# Patient Record
Sex: Female | Born: 1965
Health system: Southern US, Community
[De-identification: ages and names within clinical notes are randomized; demographics above are authoritative.]

## PROBLEM LIST (undated history)

## (undated) HISTORY — PX: HERNIA REPAIR: SHX51

---

## 1991-08-29 HISTORY — PX: FOOT SURGERY: SHX648

## 1998-03-15 ENCOUNTER — Other Ambulatory Visit: Admission: RE | Admit: 1998-03-15 | Discharge: 1998-03-15 | Payer: Self-pay | Admitting: Gynecology

## 1998-03-22 ENCOUNTER — Other Ambulatory Visit: Admission: RE | Admit: 1998-03-22 | Discharge: 1998-03-22 | Payer: Self-pay | Admitting: Gynecology

## 1998-07-14 ENCOUNTER — Encounter: Admission: RE | Admit: 1998-07-14 | Discharge: 1998-10-12 | Payer: Self-pay | Admitting: Gynecology

## 1998-09-04 ENCOUNTER — Inpatient Hospital Stay (HOSPITAL_COMMUNITY): Admission: AD | Admit: 1998-09-04 | Discharge: 1998-09-08 | Payer: Self-pay | Admitting: Gynecology

## 1998-09-09 ENCOUNTER — Encounter (HOSPITAL_COMMUNITY): Admission: RE | Admit: 1998-09-09 | Discharge: 1998-12-08 | Payer: Self-pay | Admitting: Gynecology

## 1999-12-12 ENCOUNTER — Other Ambulatory Visit: Admission: RE | Admit: 1999-12-12 | Discharge: 1999-12-12 | Payer: Self-pay | Admitting: Gynecology

## 2000-01-18 ENCOUNTER — Encounter (INDEPENDENT_AMBULATORY_CARE_PROVIDER_SITE_OTHER): Payer: Self-pay

## 2000-01-18 ENCOUNTER — Other Ambulatory Visit: Admission: RE | Admit: 2000-01-18 | Discharge: 2000-01-18 | Payer: Self-pay | Admitting: Gynecology

## 2000-06-15 ENCOUNTER — Other Ambulatory Visit: Admission: RE | Admit: 2000-06-15 | Discharge: 2000-06-15 | Payer: Self-pay | Admitting: Gynecology

## 2001-02-20 ENCOUNTER — Other Ambulatory Visit: Admission: RE | Admit: 2001-02-20 | Discharge: 2001-02-20 | Payer: Self-pay | Admitting: Gynecology

## 2002-03-27 ENCOUNTER — Other Ambulatory Visit: Admission: RE | Admit: 2002-03-27 | Discharge: 2002-03-27 | Payer: Self-pay | Admitting: Gynecology

## 2002-07-15 ENCOUNTER — Other Ambulatory Visit: Admission: RE | Admit: 2002-07-15 | Discharge: 2002-07-15 | Payer: Self-pay | Admitting: Gynecology

## 2002-11-04 ENCOUNTER — Encounter: Admission: RE | Admit: 2002-11-04 | Discharge: 2003-02-02 | Payer: Self-pay | Admitting: Gynecology

## 2003-02-09 ENCOUNTER — Inpatient Hospital Stay (HOSPITAL_COMMUNITY): Admission: AD | Admit: 2003-02-09 | Discharge: 2003-02-12 | Payer: Self-pay | Admitting: Gynecology

## 2003-02-15 ENCOUNTER — Inpatient Hospital Stay (HOSPITAL_COMMUNITY): Admission: AD | Admit: 2003-02-15 | Discharge: 2003-02-15 | Payer: Self-pay | Admitting: Gynecology

## 2003-03-20 ENCOUNTER — Other Ambulatory Visit: Admission: RE | Admit: 2003-03-20 | Discharge: 2003-03-20 | Payer: Self-pay | Admitting: Gynecology

## 2003-10-18 ENCOUNTER — Emergency Department (HOSPITAL_COMMUNITY): Admission: EM | Admit: 2003-10-18 | Discharge: 2003-10-18 | Payer: Self-pay | Admitting: Family Medicine

## 2003-12-14 ENCOUNTER — Other Ambulatory Visit: Admission: RE | Admit: 2003-12-14 | Discharge: 2003-12-14 | Payer: Self-pay | Admitting: Family Medicine

## 2003-12-21 ENCOUNTER — Encounter: Admission: RE | Admit: 2003-12-21 | Discharge: 2003-12-21 | Payer: Self-pay | Admitting: Family Medicine

## 2003-12-31 ENCOUNTER — Emergency Department (HOSPITAL_COMMUNITY): Admission: EM | Admit: 2003-12-31 | Discharge: 2003-12-31 | Payer: Self-pay | Admitting: Emergency Medicine

## 2004-12-28 ENCOUNTER — Other Ambulatory Visit: Admission: RE | Admit: 2004-12-28 | Discharge: 2004-12-28 | Payer: Self-pay | Admitting: Family Medicine

## 2007-04-04 ENCOUNTER — Other Ambulatory Visit: Admission: RE | Admit: 2007-04-04 | Discharge: 2007-04-04 | Payer: Self-pay | Admitting: Gynecology

## 2008-04-17 ENCOUNTER — Other Ambulatory Visit: Admission: RE | Admit: 2008-04-17 | Discharge: 2008-04-17 | Payer: Self-pay | Admitting: Gynecology

## 2008-11-19 ENCOUNTER — Encounter: Admission: RE | Admit: 2008-11-19 | Discharge: 2008-11-19 | Payer: Self-pay | Admitting: Gynecology

## 2009-04-19 ENCOUNTER — Encounter: Payer: Self-pay | Admitting: Gynecology

## 2009-04-19 ENCOUNTER — Ambulatory Visit: Payer: Self-pay | Admitting: Gynecology

## 2009-04-19 ENCOUNTER — Other Ambulatory Visit: Admission: RE | Admit: 2009-04-19 | Discharge: 2009-04-19 | Payer: Self-pay | Admitting: Gynecology

## 2009-12-27 ENCOUNTER — Encounter: Admission: RE | Admit: 2009-12-27 | Discharge: 2009-12-27 | Payer: Self-pay | Admitting: Gynecology

## 2010-04-20 ENCOUNTER — Other Ambulatory Visit: Admission: RE | Admit: 2010-04-20 | Discharge: 2010-04-20 | Payer: Self-pay | Admitting: Gynecology

## 2010-04-20 ENCOUNTER — Ambulatory Visit: Payer: Self-pay | Admitting: Gynecology

## 2010-12-21 ENCOUNTER — Other Ambulatory Visit: Payer: Self-pay | Admitting: Gynecology

## 2010-12-21 DIAGNOSIS — Z1231 Encounter for screening mammogram for malignant neoplasm of breast: Secondary | ICD-10-CM

## 2010-12-29 ENCOUNTER — Ambulatory Visit
Admission: RE | Admit: 2010-12-29 | Discharge: 2010-12-29 | Disposition: A | Discharge status: Home / Self Care | Payer: BC Managed Care – PPO | Source: Ambulatory Visit | Attending: Gynecology | Admitting: Gynecology

## 2010-12-29 DIAGNOSIS — Z1231 Encounter for screening mammogram for malignant neoplasm of breast: Secondary | ICD-10-CM

## 2011-01-13 NOTE — Op Note (Signed)
NAMETREVIA, NOP                       ACCOUNT NO.:  [id number]   MEDICAL RECORD NO.:  [id number]                   PATIENT TYPE:  INP   LOCATION:  9172                                 [facility]ILITY:  [facility]   PHYSICIAN:  Juan H. Lily Peer, M.D.             DATE OF BIRTH:  [birth year]   DATE OF PROCEDURE:  02/09/2003  DATE OF DISCHARGE:                                 OPERATIVE REPORT   PREOPERATIVE DIAGNOSES:  1. Term intrauterine pregnancy at 38-1/2 weeks estimated gestational age.  2. Previous cesarean section.  3. Active labor.  4. Request elective repeat cesarean section.   POSTOPERATIVE DIAGNOSES:  1. Term intrauterine pregnancy at 38-1/2 weeks estimated gestational age.  2. Previous cesarean section.  3. Active labor.  4. Request elective repeat cesarean section.   PROCEDURE PERFORMED:  Repeat lower uterine segment transverse cesarean  section.   SURGEON:  Juan H. Lily Peer, M.D.   FIRST ASSISTANT:  Maxie Better, M.D.   ANESTHESIA:  Spinal.   INDICATIONS FOR PROCEDURE:  The patient is a 45 year old gravida 4, para 1,  AB2, current at 30-1/2 weeks estimated gestational age who was scheduled for  a repeat cesarean section next week.  The patient's had previous cesarean  section secondary to failure to progress and this pregnancy with gestational  diabetes, diet controlled.  She was in the office for a routine visit and  was found to be contracting and in active labor with advanced cervical  dilatation. Cervix was 3-4 cm, 90% and -2 station (reactive fetal heart rate  tracing.   FINDINGS:  A viable female infant with Apgars of 9 and 9.  Arterial cord pH  of 7.434, with a weight of 8 pounds 2 ounces.   DESCRIPTION OF PROCEDURE:  After the patient was adequately counseled, she  was taken to the operating room where she underwent a successful spinal  anesthesia.  Her abdomen was prepped and draped in usual sterile fashion.  Foley catheter was inserted in an  effort to monitor urinary output.  A  Pfannenstiel skin incision was made 2 cm above the symphysis pubis.  Incision was carried down through the skin and subcutaneous tissue down to  the rectus fascia whereby a midline nick was made.  The fascia was incised  in a transverse fashion.  The bladder flap was established.  The lower  uterine segment was incised in transverse fashion.  Clear amniotic fluid was  present. The newborn's head was then delivered with the assistance of a  vacuum with minimal traction.  The nasopharyngeal area was bulb suctioned.  The newborn was delivered.  The cord was doubly clamped and excised and  shown to the parents and passed off to the pediatrician who gave the above  mentioned parameters and immediate cry was evidenced.  The cord blood was  obtained.  She received a gram of Cefotan IV.  The uterus was exteriorized.  The intrauterine cavity  was cleared of remaining products of conception, and  the placenta was delivered from the intrauterine cavity and passed off for  histological evaluation.  The lower uterine segment transverse incision was  closed in a locking stitch manner with a #0 Vicryl suture.  Both tubes,  ovaries and uterus appeared to be normal and was placed back into the pelvic  cavity.  The pelvic cavity was copiously irrigated with normal saline  solution.  Inspection of the lower uterine segment, transverse incision  demonstrated adequate hemostasis and the visceral peritoneum was not  reapproximated but the rectus fascia was closed with a running stitch of #0  Vicryl suture.  The subcutaneous bleeders were Bovie cauterized.  The skin  was reapproximated, followed by placement of Xeroform gauze and 4x dressing.  The patient was transferred to the recovery room with stable vital signs.  Blood loss during the procedure was recorded at 800 mL.  IV fluids was 2600  mL of lactated Ringers. Urine output was 150 mL and clear.                                                Juan H. Lily Peer, M.D.    JHF/MEDQ  D:  02/09/2003  T:  02/09/2003  Job:  [id number]

## 2011-01-13 NOTE — H&P (Signed)
NAMEREANNAH, Jessica Dougherty                       ACCOUNT NO.:  [id number]   MEDICAL RECORD NO.:  [id number]                   PATIENT TYPE:  MAT   LOCATION:  [facility]                                 [facility]:  [facility]   PHYSICIAN:  Juan H. Lily Peer, M.D.             DATE OF BIRTH:  [birth year]   DATE OF ADMISSION:  02/09/2003  DATE OF DISCHARGE:                                HISTORY & PHYSICAL   CHIEF COMPLAINT:  1. Term intrauterine pregnancy at 38-1/2 weeks estimated gestational age.  2. Previous cesarean section, for elective repeat.  3. Active labor.  4. Advanced cervical dilatation.   HISTORY:  The patient is a 45 year old gravida 4, para 1, AB 2; with a  corrected estimated date of confinement based on an ultrasound, due to  discrepancy with dates.  Her due date is 02/21/2003; currently 38-1/2 weeks  estimated gestational age.  The patient is scheduled for repeat cesarean  section next week.  She presented to the office for her routine visit, and  was placed on the monitor for NST, since she is gestational diabetic and she  is found to be contracting.  Her cervix was 2-3 cm and 90% effaced, minus  three station.  She will be taken to the operating room for repeat cesarean  section today.  Her cesarean section before was a result of failure to  progress.   Her prenatal course has been significant in the fact that she has a small  umbilical hernia.  She has gestational diabetes, and due to advanced  maternal age she had a genetic amniocentesis with normal chromosomal  studies.  Otherwise uneventful prenatal course.   PAST MEDICAL HISTORY:  She had two elective terminations in 1987 and 1989.  Primary cesarean section in year 2000.  Denies any other major medical problems.   ALLERGIES:  The patient denies any allergies.   REVIEW OF SYSTEMS:  See [place] form.   PHYSICAL EXAMINATION:  VITAL SIGNS:  Blood pressure 118/78.  Urine negative  for protein or glucose.  Weight 199  pounds.  HEENT:  Unremarkable.  NECK:  Supple. Trachea midline.  No carotid bruits; no thyromegaly.  LUNGS:  Clear to auscultation, without rhonchi or wheezes.  HEART:  Regular rate and rhythm; no murmurs or gallops.  BREASTS:  Exam not done.  ABDOMEN:  Gravid uterus, vertex presentation by Leopold's maneuver.  Fundal  height approximately 38 cm.  PELVIC:  Cervix 2-3 cm dilated, 90% effaced, -3 station.  EXTREMITIES:  Deep tendon 1+; negative clonus.   PRENATAL LABS:  A positive blood type.  Negative antibody screen.  VDRL  nonreactive.  Rubella immune.  Hepatitis B surface antigen and HIV were  negative.  Diabetes screen was abnormal, as was 3-hr GTT with two abnormal  values.  GBS culture was negative.   ASSESSMENT:  A 45 year old gravida 4, para 1, AB 2 at 38-1/2 weeks estimated  gestational age, with previous  cesarean section; scheduled for a repeat  cesarean section next week.  She presented for routine OB visit, a nonstress  test and follow-up.  Her blood sugars were doing well on her diet, and she  was found to have a reassuring fetal heart rate tracing.  She was found to  be contracting every minute to minute and a half, with advanced cervical  dilatation and cervix 3 cm, 90% effaced and -3 station.  The risks,  benefits, pros and cons of repeat cesarean section versus a trial of labor  had been previously discussed with the patient.  Will proceed with her  wishes to proceed with a repeat cesarean section today.  All questions were  answered and we will follow accordingly.   PLAN:  As per assessment above.                                               Juan H. Lily Peer, M.D.    JHF/MEDQ  D:  02/09/2003  T:  02/09/2003  Job:  [id number]

## 2011-01-13 NOTE — Discharge Summary (Signed)
   NAMEBREAHNA, Jessica Dougherty                       ACCOUNT NO.:  [id number]   MEDICAL RECORD NO.:  [id number]                   PATIENT TYPE:  INP   LOCATION:  9112                                 [facility]   PHYSICIAN:  Juan H. Lily Peer, M.D.             DATE OF BIRTH:  Jan 13, 1966   DATE OF ADMISSION:  02/09/2003  DATE OF DISCHARGE:  02/12/2003                                 DISCHARGE SUMMARY   DISCHARGE DIAGNOSES:  1. Intrauterine pregnancy at 38 weeks, delivered.  2. History of prior cesarean section; desired repeat cesarean section.  3. Labor.  4. Advanced maternal age.  5. Gestational diabetes.  6. Status post repeat lower uterine segment transverse cesarean section by     Dr. Reynaldo Minium on February 09, 2003.   HISTORY:  A 37-years-of-age female gravida 4 para 1 with an EDC of February 21, 2003.  Prenatal course had been complicated by advanced maternal age.  She  subsequently underwent a genetic amniocentesis which revealed normal  chromosomes.  Also developed gestational diabetes, diet controlled.  History  of prior cesarean section, desired repeat cesarean section.  Also had  umbilical hernia.  She presented to the office for a routine nonstress test  on the day of admission - February 09, 2003 - and was found to be contracting.  Cervix was 2-3 cm, 90%, -3 station.  Therefore, the patient was admitted for  delivery.   HOSPITAL COURSE:  On February 09, 2003 the patient was admitted at 38+ weeks in  labor and underwent a repeat lower uterine segment transverse cesarean  section by Dr. Reynaldo Minium without complications.  Underwent delivery of  a female, Apgars of 9 and 9, weight of 8 pounds 2 ounces.  Postpartum the  patient remained afebrile, voiding, in stable condition, and she was  discharged to home on February 12, 2003 in satisfactory condition and given  [facility] postpartum instructions and postpartum booklet.   ACCESSORY CLINICAL FINDINGS/LABORATORY DATA:  The  patient is A positive,  rubella immune.  On February 10, 2003 hemoglobin 10.1.   DISPOSITION:  1. The patient is discharged to home.  2. Informed to return to the office in six weeks; if any problem prior to     that time to be seen in the office.  3. Staples were not removed at the time of discharge.  She was to follow up     in the office on Monday to remove the staples.     Susa Loffler, P.A.                    Juan H. Lily Peer, M.D.    TSG/MEDQ  D:  02/27/2003  T:  02/27/2003  Job:  [id number]

## 2011-01-13 NOTE — Consult Note (Signed)
   Jessica Dougherty, Jessica Dougherty                       ACCOUNT NO.:  [id number]   MEDICAL RECORD NO.:  [id number]                   PATIENT TYPE:  MAT   LOCATION:  [facility]                                 [facility]   PHYSICIAN:  Juan H. Lily Peer, M.D.             DATE OF BIRTH:  1966/07/26   DATE OF CONSULTATION:  02/15/2003  DATE OF DISCHARGE:                                   CONSULTATION   HISTORY OF PRESENT ILLNESS:  The patient is a 45 year old who is status post  repeat cesarean section earlier in the week, and due to the fact that she  had wound breakdown with her previous pregnancy when she was discharged home  approximately three days ago, she was asked to return to the office next  week to have her staples removed.  She presented to the emergency room today  stating that she was having some drainage from her incision and her staples  were bothering her.  She had some form of a rash over her staples.  On  inspection, there is some mild erythema that was noted.  The staples were  removed.  The skin is intact.  The small defect area drainage was evident.  The patient was informed that this would probably continue to drain, which  was good.  In the event of any mild early cellulitis, she will be placed on  Dicloxacillin 500 mg q.i.d. for a 10 day course.  She will follow up in the  office next week.  On further discussion, it appears that the patient is  undergoing postpartum depression.  She does have good family support, but  she gets teary-eyed and depressed.  No suicidal ideation reported.  We  discussed the use of SSRI's in pregnancy and lactation.  I will go ahead and  start her on Zoloft 50 mg one p.o. daily, but initially for the first week  she will take 1/2 a table and then increase it to the full dose, and we will  keep her on it for a six month period.   PHYSICAL EXAMINATION:  VITAL SIGNS:  Temperature 99.3, pulse 67,  respirations 20, blood pressure 138/68.  PELVIC:   Her lochia was decreasing.   Instructions were provided.  Will follow accordingly.                                               Juan H. Lily Peer, M.D.    JHF/MEDQ  D:  02/15/2003  T:  02/15/2003  Job:  [id number]

## 2011-04-28 ENCOUNTER — Ambulatory Visit (INDEPENDENT_AMBULATORY_CARE_PROVIDER_SITE_OTHER): Payer: BC Managed Care – PPO | Admitting: Gynecology

## 2011-04-28 ENCOUNTER — Encounter: Payer: Self-pay | Admitting: Gynecology

## 2011-04-28 DIAGNOSIS — B373 Candidiasis of vulva and vagina: Secondary | ICD-10-CM

## 2011-04-28 DIAGNOSIS — N898 Other specified noninflammatory disorders of vagina: Secondary | ICD-10-CM

## 2011-04-28 MED ORDER — FLUCONAZOLE 150 MG PO TABS: 150.0000 mg | ORAL_TABLET | Freq: Once | ORAL | Status: AC

## 2011-04-28 NOTE — Progress Notes (Signed)
Patient presents had a retained tampon for about a week removed it about a week ago as noted since and a vaginal discharge and of the last several days some diarrhea. No fevers chills nausea vomiting or any rashes. Exam Abdomen: Soft nontender without masses guarding rebound organomegaly Pelvic: External BUS vagina White discharge KOH wet prep done, cervix normal, uterus normal size midline mobile nontender adnexa without masses or tenderness Assessment and plan: #1 White discharge. KOH wet prep is positive for yeast we'll treat with Diflucan 150x1 dose followup if symptoms persist or recur. #2 Retained tampon. Vaginal exam is normal without abrasions erythema patient otherwise is doing well except for some diarrhea. I doubt toxic shock. Patient was given signs and symptoms and will follow at present. She is due for annual exam and will schedule a followup for this.

## 2011-05-19 ENCOUNTER — Encounter: Payer: Self-pay | Admitting: Gynecology

## 2011-05-19 ENCOUNTER — Ambulatory Visit (INDEPENDENT_AMBULATORY_CARE_PROVIDER_SITE_OTHER): Payer: BC Managed Care – PPO | Admitting: Gynecology

## 2011-05-19 ENCOUNTER — Other Ambulatory Visit (HOSPITAL_COMMUNITY)
Admission: RE | Admit: 2011-05-19 | Discharge: 2011-05-19 | Disposition: A | Discharge status: Home / Self Care | Payer: BC Managed Care – PPO | Source: Ambulatory Visit | Attending: Gynecology | Admitting: Gynecology

## 2011-05-19 VITALS — BP 120/70 | Ht 61.5 in | Wt 172.0 lb

## 2011-05-19 DIAGNOSIS — Z131 Encounter for screening for diabetes mellitus: Secondary | ICD-10-CM

## 2011-05-19 DIAGNOSIS — Z13220 Encounter for screening for lipoid disorders: Secondary | ICD-10-CM

## 2011-05-19 DIAGNOSIS — R82998 Other abnormal findings in urine: Secondary | ICD-10-CM

## 2011-05-19 DIAGNOSIS — N898 Other specified noninflammatory disorders of vagina: Secondary | ICD-10-CM

## 2011-05-19 DIAGNOSIS — Z01419 Encounter for gynecological examination (general) (routine) without abnormal findings: Secondary | ICD-10-CM

## 2011-05-19 DIAGNOSIS — B373 Candidiasis of vulva and vagina: Secondary | ICD-10-CM

## 2011-05-19 MED ORDER — FLUCONAZOLE 200 MG PO TABS: 200.0000 mg | ORAL_TABLET | Freq: Every day | ORAL | Status: AC

## 2011-05-19 NOTE — Progress Notes (Signed)
Jessica Dougherty [birth year] [id number]        45 y.o.  for annual exam.  Overall doing well. Recently treated for yeast vaginitis has a little bit of itching discharge wants to make sure that is gone. Also has been having some issues with diarrhea. She saw her primary Dr. Hyacinth Meeker who did some blood work is everything was normal but the diarrhea has persisted and asked about referral to a gastroenterologist.  Past medical history,surgical history, medications, allergies, family history and social history were all reviewed and documented in the EPIC chart. ROS:  Was performed and pertinent positives and negatives are included in the history.  Exam: chaperone present Filed Vitals:   05/19/11 1556  BP: 120/70   General appearance  Normal Skin grossly normal Head/Neck normal with no cervical or supraclavicular adenopathy thyroid normal Lungs  clear Cardiac RR, without RMG Abdominal  soft, nontender, without masses, organomegaly or hernia Breasts  examined lying and sitting without masses, retractions, discharge or axillary adenopathy. Pelvic  Ext/BUS/vagina  normal white discharge noted KOH wet prep done  Cervix  normal  Pap done  Uterus  Antevert it, normal size, shape and contour, midline and mobile nontender   Adnexa  Without masses or tenderness    Anus and perineum  normal   Rectovaginal  normal sphincter tone without palpated masses or tenderness.    Assessment/Plan:  45 y.o. female for annual exam.    1. White discharge. Wet prep is positive for yeast we'll treat with Diflucan 200 mg daily x3 days follow up if symptoms persist or recur. 2. Diarrhea. Discussed possibilities to include irritable bowel syndrome. Will refer to gastroenterology. She is being seen by an [place] physician we'll stay with the [employer]. 3. Health maintenance. Self breast exams on the basis discussed encouraged. She had her mammogram in May we'll continue with annual mammography. Using vasectomy for birth  control. Baseline CBC glucose lipid profile urinalysis ordered. Assuming she continues well she'll see me in a year sooner as needed    Dara Lords MD, 4:47 PM 05/19/2011

## 2011-11-23 ENCOUNTER — Other Ambulatory Visit: Payer: Self-pay | Admitting: Gynecology

## 2011-11-23 DIAGNOSIS — Z1231 Encounter for screening mammogram for malignant neoplasm of breast: Secondary | ICD-10-CM

## 2012-01-01 ENCOUNTER — Ambulatory Visit
Admission: RE | Admit: 2012-01-01 | Discharge: 2012-01-01 | Disposition: A | Discharge status: Home / Self Care | Payer: BC Managed Care – PPO | Source: Ambulatory Visit | Attending: Gynecology | Admitting: Gynecology

## 2012-01-01 DIAGNOSIS — Z1231 Encounter for screening mammogram for malignant neoplasm of breast: Secondary | ICD-10-CM

## 2012-10-25 ENCOUNTER — Encounter: Payer: Self-pay | Admitting: Gynecology

## 2012-10-25 ENCOUNTER — Ambulatory Visit (INDEPENDENT_AMBULATORY_CARE_PROVIDER_SITE_OTHER): Payer: BC Managed Care – PPO | Admitting: Gynecology

## 2012-10-25 VITALS — BP 120/76 | Ht 61.0 in | Wt 180.0 lb

## 2012-10-25 DIAGNOSIS — Z01419 Encounter for gynecological examination (general) (routine) without abnormal findings: Secondary | ICD-10-CM

## 2012-10-25 DIAGNOSIS — Z13220 Encounter for screening for lipoid disorders: Secondary | ICD-10-CM

## 2012-10-25 DIAGNOSIS — K429 Umbilical hernia without obstruction or gangrene: Secondary | ICD-10-CM

## 2012-10-25 LAB — CBC WITH DIFFERENTIAL/PLATELET
Basophils Absolute: 0 10*3/uL (ref 0.0–0.1)
Eosinophils Absolute: 0.3 10*3/uL (ref 0.0–0.7)
Eosinophils Relative: 3 % (ref 0–5)
Lymphs Abs: 2 10*3/uL (ref 0.7–4.0)
MCH: 29.7 pg (ref 26.0–34.0)
MCV: 85.9 fL (ref 78.0–100.0)
Platelets: 338 10*3/uL (ref 150–400)
RDW: 13 % (ref 11.5–15.5)

## 2012-10-25 LAB — COMPREHENSIVE METABOLIC PANEL
ALT: 38 U/L — ABNORMAL HIGH (ref 0–35)
CO2: 25 mEq/L (ref 19–32)
Creat: 0.6 mg/dL (ref 0.50–1.10)
Total Bilirubin: 0.2 mg/dL — ABNORMAL LOW (ref 0.3–1.2)

## 2012-10-25 LAB — LIPID PANEL
Cholesterol: 128 mg/dL (ref 0–200)
HDL: 39 mg/dL — ABNORMAL LOW (ref >39)
Total CHOL/HDL Ratio: 3.3 Ratio
Triglycerides: 193 mg/dL — ABNORMAL HIGH (ref <150)
VLDL: 39 mg/dL (ref 0–40)

## 2012-10-25 NOTE — Patient Instructions (Addendum)
Followup for mammogram in May when due. Followup for annual exam in one year.  Consider Stop Smoking.  Help is available at [facility] smoking cessation program @ www.[facility].com or 7546441559. OR 1-800-QUIT-NOW 640-597-6941) for free smoking cessation counseling.  Smokefree.gov (http://www.[place].com/) provides free, accurate, evidence-based information and professional assistance to help support the immediate and long-term needs of people trying to quit smoking.    Smoking Hazards Smoking cigarettes is extremely bad for your health. Tobacco smoke has over 200 known poisons in it. There are over 60 chemicals in tobacco smoke that cause cancer. Some of the chemicals found in cigarette smoke include:  Cyanide.  Benzene.  Formaldehyde.  Methanol (wood alcohol).  Acetylene (fuel used in welding torches).  Ammonia.  Cigarette smoke also contains the poisonous gases nitrogen oxide and carbon monoxide.  Cigarette smokers have an increased risk of many serious medical problems, including: Lung cancer.  Lung disease (such as pneumonia, bronchitis, and emphysema).  Heart attack and chest pain due to the heart not getting enough oxygen (angina).  Heart disease and peripheral blood vessel disease.  Hypertension.  Stroke.  Oral cancer (cancer of the lip, mouth, or voice box).  Bladder cancer.  Pancreatic cancer.  Cervical cancer.  Pregnancy complications, including premature birth.  Low birthweight babies.  Early menopause.  Lower estrogen level for women.  Infertility.  Facial wrinkles.  Blindness.  Increased risk of broken bones (fractures).  Senile dementia.  Stillbirths and smaller newborn babies, birth defects, and genetic damage to sperm.  Stomach ulcers and internal bleeding.  Children of smokers have an increased risk of the following, because of secondhand smoke exposure:  Sudden infant death syndrome (SIDS).  Respiratory infections.  Lung cancer.  Heart  disease.  Ear infections.  Smoking causes approximately: 90% of all lung cancer deaths in men.  80% of all lung cancer deaths in women.  90% of deaths from chronic obstructive lung disease.  Compared with nonsmokers, smoking increases the risk of: Coronary heart disease by 2 to 4 times.  Stroke by 2 to 4 times.  Men developing lung cancer by 23 times.  Women developing lung cancer by 13 times.  Dying from chronic obstructive lung diseases by 12 times.  Someone who smokes 2 packs a day loses about 8 years of his or her life. Even smoking lightly shortens your life expectancy by several years. You can greatly reduce the risk of medical problems for you and your family by stopping now. Smoking is the most preventable cause of death and disease in our society. Within days of quitting smoking, your circulation returns to normal, you decrease the risk of having a heart attack, and your lung capacity improves. There may be some increased phlegm in the first few days after quitting, and it may take months for your lungs to clear up completely. Quitting for 10 years cuts your lung cancer risk to almost that of a nonsmoker. WHY IS SMOKING ADDICTIVE? Nicotine is the chemical agent in tobacco that is capable of causing addiction or dependence.  When you smoke and inhale, nicotine is absorbed rapidly into the bloodstream through your lungs. Nicotine absorbed through the lungs is capable of creating a powerful addiction. Both inhaled and non-inhaled nicotine may be addictive.  Addiction studies of cigarettes and spit tobacco show that addiction to nicotine occurs mainly during the teen years, when young people begin using tobacco products.  WHAT ARE THE BENEFITS OF QUITTING?  There are many health benefits to quitting smoking.  Likelihood of developing cancer and heart disease decreases. Health improvements are seen almost immediately.  Blood pressure, pulse rate, and breathing patterns start returning to  normal soon after quitting.  People who quit may see an improvement in their overall quality of life.  Some people choose to quit all at once. Other options include nicotine replacement products, such as patches, gum, and nasal sprays. Do not use these products without first checking with your caregiver. QUITTING SMOKING It is not easy to quit smoking. Nicotine is addicting, and longtime habits are hard to change. To start, you can write down all your reasons for quitting, tell your family and friends you want to quit, and ask for their help. Throw your cigarettes away, chew gum or cinnamon sticks, keep your hands busy, and drink extra water or juice. Go for walks and practice deep breathing to relax. Think of all the money you are saving: around $1,000 a year, for the average pack-a-day smoker. Nicotine patches and gum have been shown to improve success at efforts to stop smoking. Zyban (bupropion) is an anti-depressant drug that can be prescribed to reduce nicotine withdrawal symptoms and to suppress the urge to smoke. Smoking is an addiction with both physical and psychological effects. Joining a stop-smoking support group can help you cope with the emotional issues. For more information and advice on programs to stop smoking, call your doctor, your local hospital, or these organizations: American Lung Association - 1-800-LUNGUSA   Smoking Cessation  This document explains the best ways for you to quit smoking and new treatments to help. It lists new medicines that can double or triple your chances of quitting and quitting for good. It also considers ways to avoid relapses and concerns you may have about quitting, including weight gain. NICOTINE: A POWERFUL ADDICTION If you have tried to quit smoking, you know how hard it can be. It is hard because nicotine is a very addictive drug. For some people, it can be as addictive as heroin or cocaine. Usually, people make 2 or 3 tries, or more, before finally  being able to quit. Each time you try to quit, you can learn about what helps and what hurts. Quitting takes hard work and a lot of effort, but you can quit smoking. QUITTING SMOKING IS ONE OF THE MOST IMPORTANT THINGS YOU WILL EVER DO.  You will live longer, feel better, and live better.   The impact on your body of quitting smoking is felt almost immediately:   Within 20 minutes, blood pressure decreases. Pulse returns to its normal level.   After 8 hours, carbon monoxide levels in the blood return to normal. Oxygen level increases.   After 24 hours, chance of heart attack starts to decrease. Breath, hair, and body stop smelling like smoke.   After 48 hours, damaged nerve endings begin to recover. Sense of taste and smell improve.   After 72 hours, the body is virtually free of nicotine. Bronchial tubes relax and breathing becomes easier.   After 2 to 12 weeks, lungs can hold more air. Exercise becomes easier and circulation improves.   Quitting will reduce your risk of having a heart attack, stroke, cancer, or lung disease:   After 1 year, the risk of coronary heart disease is cut in half.   After 5 years, the risk of stroke falls to the same as a nonsmoker.   After 10 years, the risk of lung cancer is cut in half and the risk of other cancers  decreases significantly.   After 15 years, the risk of coronary heart disease drops, usually to the level of a nonsmoker.   If you are pregnant, quitting smoking will improve your chances of having a healthy baby.   The people you live with, especially your children, will be healthier.   You will have extra money to spend on things other than cigarettes.  FIVE KEYS TO QUITTING Studies have shown that these 5 steps will help you quit smoking and quit for good. You have the best chances of quitting if you use them together: 1. Get ready.  2. Get support and encouragement.  3. Learn new skills and behaviors.  4. Get medicine to reduce  your nicotine addiction and use it correctly.  5. Be prepared for relapse or difficult situations. Be determined to continue trying to quit, even if you do not succeed at first.  1. GET READY  Set a quit date.   Change your environment.   Get rid of ALL cigarettes, ashtrays, matches, and lighters in your home, car, and place of work.   Do not let people smoke in your home.   Review your past attempts to quit. Think about what worked and what did not.   Once you quit, do not smoke. NOT EVEN A PUFF!  2. GET SUPPORT AND ENCOURAGEMENT Studies have shown that you have a better chance of being successful if you have help. You can get support in many ways.  Tell your family, friends, and coworkers that you are going to quit and need their support. Ask them not to smoke around you.   Talk to your caregivers (doctor, dentist, nurse, pharmacist, psychologist, and/or smoking counselor).   Get individual, group, or telephone counseling and support. The more counseling you have, the better your chances are of quitting. Programs are available at [employer] and health centers. Call your local health department for information about programs in your area.   Spiritual beliefs and practices may help some smokers quit.   Quit meters are Insurance underwriter that keep track of quit statistics, such as amount of "quit-time," cigarettes not smoked, and money saved.   Many smokers find one or more of the many self-help books available useful in helping them quit and stay off tobacco.  3. LEARN NEW SKILLS AND BEHAVIORS  Try to distract yourself from urges to smoke. Talk to someone, go for a walk, or occupy your time with a task.   When you first try to quit, change your routine. Take a different route to work. Drink tea instead of coffee. Eat breakfast in a different place.   Do something to reduce your stress. Take a hot bath, exercise, or read a book.   Plan something  enjoyable to do every day. Reward yourself for not smoking.   Explore interactive web-based programs that specialize in helping you quit.  4. GET MEDICINE AND USE IT CORRECTLY Medicines can help you stop smoking and decrease the urge to smoke. Combining medicine with the above behavioral methods and support can quadruple your chances of successfully quitting smoking. The [place] Food and Drug Administration (FDA) has approved 7 medicines to help you quit smoking. These medicines fall into 3 categories.  Nicotine replacement therapy (delivers nicotine to your body without the negative effects and risks of smoking):   Nicotine gum: Available over-the-counter.   Nicotine lozenges: Available over-the-counter.   Nicotine inhaler: Available by prescription.   Nicotine nasal spray: Available by prescription.  Nicotine skin patches (transdermal): Available by prescription and over-the-counter.   Antidepressant medicine (helps people abstain from smoking, but how this works is unknown):   Bupropion sustained-release (SR) tablets: Available by prescription.   Nicotinic receptor partial agonist (simulates the effect of nicotine in your brain):   Varenicline tartrate tablets: Available by prescription.   Ask your caregiver for advice about which medicines to use and how to use them. Carefully read the information on the package.   Everyone who is trying to quit may benefit from using a medicine. If you are pregnant or trying to become pregnant, nursing an infant, you are under age 57, or you smoke fewer than 10 cigarettes per day, talk to your caregiver before taking any nicotine replacement medicines.   You should stop using a nicotine replacement product and call your caregiver if you experience nausea, dizziness, weakness, vomiting, fast or irregular heartbeat, mouth problems with the lozenge or gum, or redness or swelling of the skin around the patch that does not go away.   Do not use any  other product containing nicotine while using a nicotine replacement product.   Talk to your caregiver before using these products if you have diabetes, heart disease, asthma, stomach ulcers, you had a recent heart attack, you have high blood pressure that is not controlled with medicine, a history of irregular heartbeat, or you have been prescribed medicine to help you quit smoking.  5. BE PREPARED FOR RELAPSE OR DIFFICULT SITUATIONS  Most relapses occur within the first 3 months after quitting. Do not be discouraged if you start smoking again. Remember, most people try several times before they finally quit.   You may have symptoms of withdrawal because your body is used to nicotine. You may crave cigarettes, be irritable, feel very hungry, cough often, get headaches, or have difficulty concentrating.   The withdrawal symptoms are only temporary. They are strongest when you first quit, but they will go away within 10 to 14 days.  Here are some difficult situations to watch for:  Alcohol. Avoid drinking alcohol. Drinking lowers your chances of successfully quitting.   Caffeine. Try to reduce the amount of caffeine you consume. It also lowers your chances of successfully quitting.   Other smokers. Being around smoking can make you want to smoke. Avoid smokers.   Weight gain. Many smokers will gain weight when they quit, usually less than 10 pounds. Eat a healthy diet and stay active. Do not let weight gain distract you from your main goal, quitting smoking. Some medicines that help you quit smoking may also help delay weight gain. You can always lose the weight gained after you quit.   Bad mood or depression. There are a lot of ways to improve your mood other than smoking.  If you are having problems with any of these situations, talk to your caregiver. SPECIAL SITUATIONS AND CONDITIONS Studies suggest that everyone can quit smoking. Your situation or condition can give you a special reason to  quit.  Pregnant women/new mothers: By quitting, you protect your baby's health and your own.   Hospitalized patients: By quitting, you reduce health problems and help healing.   Heart attack patients: By quitting, you reduce your risk of a second heart attack.   Lung, head, and neck cancer patients: By quitting, you reduce your chance of a second cancer.   Parents of children and adolescents: By quitting, you protect your children from illnesses caused by secondhand smoke.  QUESTIONS TO THINK ABOUT  Think about the following questions before you try to stop smoking. You may want to talk about your answers with your caregiver.  Why do you want to quit?   If you tried to quit in the past, what helped and what did not?   What will be the most difficult situations for you after you quit? How will you plan to handle them?   Who can help you through the tough times? Your family? Friends? Caregiver?   What pleasures do you get from smoking? What ways can you still get pleasure if you quit?  Here are some questions to ask your caregiver:  How can you help me to be successful at quitting?   What medicine do you think would be best for me and how should I take it?   What should I do if I need more help?   What is smoking withdrawal like? How can I get information on withdrawal?  Quitting takes hard work and a lot of effort, but you can quit smoking.

## 2012-10-25 NOTE — Progress Notes (Signed)
ALEXSUS PAPADOPOULOS 03/19/1966 [id number]        47 y.o.  [id number] for annual exam.  Doing well with several issues noted below.  Past medical history,surgical history, medications, allergies, family history and social history were all reviewed and documented in the EPIC chart. ROS:  Was performed and pertinent positives and negatives are included in the history.  Exam: Kim assistant Filed Vitals:   10/25/12 1536  BP: 120/76  Height: 5\' 1"  (1.549 m)  Weight: 180 lb (81.647 kg)   General appearance  Normal Skin grossly normal Head/Neck normal with no cervical or supraclavicular adenopathy thyroid normal Lungs  clear Cardiac RR, without RMG Abdominal  soft, nontender, without masses, organomegaly. Small reducible umbilical hernia Breasts  examined lying and sitting without masses, retractions, discharge or axillary adenopathy. Pelvic  Ext/BUS/vagina  normal   Cervix  normal   Uterus  anteverted, normal size, shape and contour, midline and mobile nontender   Adnexa  Without masses or tenderness    Anus and perineum  normal   Rectovaginal  normal sphincter tone without palpated masses or tenderness.    Assessment/Plan:  47 y.o. [id number] female for annual exam, regular menses, vasectomy birth control.   1. Umbilical hernia. In presence since her last pregnancy. Issues of incarceration with signs and symptoms reviewed. Gen. surgical referral if she desires repair. 2. Mammography due in May and she knows to schedule this. SBE monthly reviewed. 3. Pap smear 04/2011. No Pap smear done today. No history of abnormal Pap smears. Plan 3 year interval screening. 4. Stop smoking reviewed. 5. Health maintenance. Baseline CBC comprehensive metabolic panel lipid profile urinalysis ordered.    Dara Lords MD, 4:18 PM 10/25/2012

## 2012-10-26 LAB — URINALYSIS W MICROSCOPIC + REFLEX CULTURE
Bacteria, UA: NONE SEEN
Bilirubin Urine: NEGATIVE
Hgb urine dipstick: NEGATIVE
Ketones, ur: NEGATIVE mg/dL
Protein, ur: NEGATIVE mg/dL
Urobilinogen, UA: 0.2 mg/dL (ref 0.0–1.0)

## 2013-02-17 ENCOUNTER — Other Ambulatory Visit: Payer: Self-pay

## 2013-02-17 DIAGNOSIS — Z1231 Encounter for screening mammogram for malignant neoplasm of breast: Secondary | ICD-10-CM

## 2013-02-26 ENCOUNTER — Ambulatory Visit: Payer: BC Managed Care – PPO

## 2013-03-04 ENCOUNTER — Ambulatory Visit
Admission: RE | Admit: 2013-03-04 | Discharge: 2013-03-04 | Disposition: A | Discharge status: Home / Self Care | Payer: BC Managed Care – PPO | Source: Ambulatory Visit

## 2013-03-04 DIAGNOSIS — Z1231 Encounter for screening mammogram for malignant neoplasm of breast: Secondary | ICD-10-CM

## 2013-10-29 ENCOUNTER — Encounter: Payer: Self-pay | Admitting: Gynecology

## 2013-11-19 ENCOUNTER — Ambulatory Visit (INDEPENDENT_AMBULATORY_CARE_PROVIDER_SITE_OTHER): Payer: BC Managed Care – PPO | Admitting: Gynecology

## 2013-11-19 ENCOUNTER — Other Ambulatory Visit (HOSPITAL_COMMUNITY)
Admission: RE | Admit: 2013-11-19 | Discharge: 2013-11-19 | Disposition: A | Discharge status: Home / Self Care | Payer: BC Managed Care – PPO | Source: Ambulatory Visit | Attending: Gynecology | Admitting: Gynecology

## 2013-11-19 ENCOUNTER — Encounter: Payer: Self-pay | Admitting: Gynecology

## 2013-11-19 VITALS — BP 122/78 | Ht 61.0 in | Wt 173.0 lb

## 2013-11-19 DIAGNOSIS — Z01419 Encounter for gynecological examination (general) (routine) without abnormal findings: Secondary | ICD-10-CM | POA: Insufficient documentation

## 2013-11-19 DIAGNOSIS — Z1151 Encounter for screening for human papillomavirus (HPV): Secondary | ICD-10-CM | POA: Insufficient documentation

## 2013-11-19 LAB — CBC WITH DIFFERENTIAL/PLATELET
BASOS ABS: 0 10*3/uL (ref 0.0–0.1)
BASOS PCT: 0 % (ref 0–1)
EOS ABS: 0.2 10*3/uL (ref 0.0–0.7)
EOS PCT: 3 % (ref 0–5)
HEMATOCRIT: 42.8 % (ref 36.0–46.0)
Hemoglobin: 14.7 g/dL (ref 12.0–15.0)
LYMPHS PCT: 26 % (ref 12–46)
Lymphs Abs: 1.9 10*3/uL (ref 0.7–4.0)
MCH: 29.5 pg (ref 26.0–34.0)
MCHC: 34.3 g/dL (ref 30.0–36.0)
MCV: 85.8 fL (ref 78.0–100.0)
MONO ABS: 0.4 10*3/uL (ref 0.1–1.0)
Monocytes Relative: 5 % (ref 3–12)
Neutro Abs: 4.8 10*3/uL (ref 1.7–7.7)
Neutrophils Relative %: 66 % (ref 43–77)
Platelets: 366 10*3/uL (ref 150–400)
RBC: 4.99 MIL/uL (ref 3.87–5.11)
RDW: 12.9 % (ref 11.5–15.5)
WBC: 7.3 10*3/uL (ref 4.0–10.5)

## 2013-11-19 LAB — COMPREHENSIVE METABOLIC PANEL
ALT: 17 U/L (ref 0–35)
AST: 14 U/L (ref 0–37)
Albumin: 4.3 g/dL (ref 3.5–5.2)
Alkaline Phosphatase: 61 U/L (ref 39–117)
BILIRUBIN TOTAL: 0.3 mg/dL (ref 0.2–1.2)
BUN: 13 mg/dL (ref 6–23)
CALCIUM: 9 mg/dL (ref 8.4–10.5)
CHLORIDE: 104 meq/L (ref 96–112)
CO2: 27 mEq/L (ref 19–32)
CREATININE: 0.75 mg/dL (ref 0.50–1.10)
Glucose, Bld: 85 mg/dL (ref 70–99)
Potassium: 4.2 mEq/L (ref 3.5–5.3)
Sodium: 138 mEq/L (ref 135–145)
Total Protein: 6.3 g/dL (ref 6.0–8.3)

## 2013-11-19 LAB — LIPID PANEL
CHOL/HDL RATIO: 2.2 ratio
Cholesterol: 82 mg/dL (ref 0–200)
HDL: 38 mg/dL — AB (ref >39)
LDL Cholesterol: 34 mg/dL (ref 0–99)
Triglycerides: 48 mg/dL (ref <150)
VLDL: 10 mg/dL (ref 0–40)

## 2013-11-19 NOTE — Patient Instructions (Signed)
Followup in one year, sooner as needed.  You may obtain a copy of any labs that were done today by logging onto MyChart as outlined in the instructions provided with your AVS (after visit summary). The office will not call with normal lab results but certainly if there are any significant abnormalities then we will contact you.   Health Maintenance, Female A healthy lifestyle and preventative care can promote health and wellness.  Maintain regular health, dental, and eye exams.  Eat a healthy diet. Foods like vegetables, fruits, whole grains, low-fat dairy products, and lean protein foods contain the nutrients you need without too many calories. Decrease your intake of foods high in solid fats, added sugars, and salt. Get information about a proper diet from your caregiver, if necessary.  Regular physical exercise is one of the most important things you can do for your health. Most adults should get at least 150 minutes of moderate-intensity exercise (any activity that increases your heart rate and causes you to sweat) each week. In addition, most adults need muscle-strengthening exercises on 2 or more days a week.   Maintain a healthy weight. The body mass index (BMI) is a screening tool to identify possible weight problems. It provides an estimate of body fat based on height and weight. Your caregiver can help determine your BMI, and can help you achieve or maintain a healthy weight. For adults 20 years and older:  A BMI below 18.5 is considered underweight.  A BMI of 18.5 to 24.9 is normal.  A BMI of 25 to 29.9 is considered overweight.  A BMI of 30 and above is considered obese.  Maintain normal blood lipids and cholesterol by exercising and minimizing your intake of saturated fat. Eat a balanced diet with plenty of fruits and vegetables. Blood tests for lipids and cholesterol should begin at age 41 and be repeated every 5 years. If your lipid or cholesterol levels are high, you are over  50, or you are a high risk for heart disease, you may need your cholesterol levels checked more frequently.Ongoing high lipid and cholesterol levels should be treated with medicines if diet and exercise are not effective.  If you smoke, find out from your caregiver how to quit. If you do not use tobacco, do not start.  Lung cancer screening is recommended for adults aged 49 80 years who are at high risk for developing lung cancer because of a history of smoking. Yearly low-dose computed tomography (CT) is recommended for people who have at least a 30-pack-year history of smoking and are a current smoker or have quit within the past 15 years. A pack year of smoking is smoking an average of 1 pack of cigarettes a day for 1 year (for example: 1 pack a day for 30 years or 2 packs a day for 15 years). Yearly screening should continue until the smoker has stopped smoking for at least 15 years. Yearly screening should also be stopped for people who develop a health problem that would prevent them from having lung cancer treatment.  If you are pregnant, do not drink alcohol. If you are breastfeeding, be very cautious about drinking alcohol. If you are not pregnant and choose to drink alcohol, do not exceed 1 drink per day. One drink is considered to be 12 ounces (355 mL) of beer, 5 ounces (148 mL) of wine, or 1.5 ounces (44 mL) of liquor.  Avoid use of street drugs. Do not share needles with anyone. Ask for help  if you need support or instructions about stopping the use of drugs.  High blood pressure causes heart disease and increases the risk of stroke. Blood pressure should be checked at least every 1 to 2 years. Ongoing high blood pressure should be treated with medicines, if weight loss and exercise are not effective.  If you are 55 to 47 years old, ask your caregiver if you should take aspirin to prevent strokes.  Diabetes screening involves taking a blood sample to check your fasting blood sugar level.  This should be done once every 3 years, after age 45, if you are within normal weight and without risk factors for diabetes. Testing should be considered at a younger age or be carried out more frequently if you are overweight and have at least 1 risk factor for diabetes.  Breast cancer screening is essential preventative care for women. You should practice "breast self-awareness." This means understanding the normal appearance and feel of your breasts and may include breast self-examination. Any changes detected, no matter how small, should be reported to a caregiver. Women in their 20s and 30s should have a clinical breast exam (CBE) by a caregiver as part of a regular health exam every 1 to 3 years. After age 40, women should have a CBE every year. Starting at age 40, women should consider having a mammogram (breast X-ray) every year. Women who have a family history of breast cancer should talk to their caregiver about genetic screening. Women at a high risk of breast cancer should talk to their caregiver about having an MRI and a mammogram every year.  Breast cancer gene (BRCA)-related cancer risk assessment is recommended for women who have family members with BRCA-related cancers. BRCA-related cancers include breast, ovarian, tubal, and peritoneal cancers. Having family members with these cancers may be associated with an increased risk for harmful changes (mutations) in the breast cancer genes BRCA1 and BRCA2. Results of the assessment will determine the need for genetic counseling and BRCA1 and BRCA2 testing.  The Pap test is a screening test for cervical cancer. Women should have a Pap test starting at age 21. Between ages 21 and 29, Pap tests should be repeated every 2 years. Beginning at age 30, you should have a Pap test every 3 years as long as the past 3 Pap tests have been normal. If you had a hysterectomy for a problem that was not cancer or a condition that could lead to cancer, then you no  longer need Pap tests. If you are between ages 65 and 70, and you have had normal Pap tests going back 10 years, you no longer need Pap tests. If you have had past treatment for cervical cancer or a condition that could lead to cancer, you need Pap tests and screening for cancer for at least 20 years after your treatment. If Pap tests have been discontinued, risk factors (such as a new sexual partner) need to be reassessed to determine if screening should be resumed. Some women have medical problems that increase the chance of getting cervical cancer. In these cases, your caregiver may recommend more frequent screening and Pap tests.  The human papillomavirus (HPV) test is an additional test that may be used for cervical cancer screening. The HPV test looks for the virus that can cause the cell changes on the cervix. The cells collected during the Pap test can be tested for HPV. The HPV test could be used to screen women aged 30 years and older, and   should be used in women of any age who have unclear Pap test results. After the age of 30, women should have HPV testing at the same frequency as a Pap test.  Colorectal cancer can be detected and often prevented. Most routine colorectal cancer screening begins at the age of 50 and continues through age 75. However, your caregiver may recommend screening at an earlier age if you have risk factors for colon cancer. On a yearly basis, your caregiver may provide home test kits to check for hidden blood in the stool. Use of a small camera at the end of a tube, to directly examine the colon (sigmoidoscopy or colonoscopy), can detect the earliest forms of colorectal cancer. Talk to your caregiver about this at age 50, when routine screening begins. Direct examination of the colon should be repeated every 5 to 10 years through age 75, unless early forms of pre-cancerous polyps or small growths are found.  Hepatitis C blood testing is recommended for all people born from  1945 through 1965 and any individual with known risks for hepatitis C.  Practice safe sex. Use condoms and avoid high-risk sexual practices to reduce the spread of sexually transmitted infections (STIs). Sexually active women aged 25 and younger should be checked for Chlamydia, which is a common sexually transmitted infection. Older women with new or multiple partners should also be tested for Chlamydia. Testing for other STIs is recommended if you are sexually active and at increased risk.  Osteoporosis is a disease in which the bones lose minerals and strength with aging. This can result in serious bone fractures. The risk of osteoporosis can be identified using a bone density scan. Women ages 65 and over and women at risk for fractures or osteoporosis should discuss screening with their caregivers. Ask your caregiver whether you should be taking a calcium supplement or vitamin D to reduce the rate of osteoporosis.  Menopause can be associated with physical symptoms and risks. Hormone replacement therapy is available to decrease symptoms and risks. You should talk to your caregiver about whether hormone replacement therapy is right for you.  Use sunscreen. Apply sunscreen liberally and repeatedly throughout the day. You should seek shade when your shadow is shorter than you. Protect yourself by wearing long sleeves, pants, a wide-brimmed hat, and sunglasses year round, whenever you are outdoors.  Notify your caregiver of new moles or changes in moles, especially if there is a change in shape or color. Also notify your caregiver if a mole is larger than the size of a pencil eraser.  Stay current with your immunizations. Document Released: 02/27/2011 Document Revised: 12/09/2012 Document Reviewed: 02/27/2011 [employer] Patient Information 2014 [employer].   

## 2013-11-19 NOTE — Progress Notes (Signed)
Jessica Hanksmily L Dougherty [birth year] [id number]        48 y.o.  [id number] for annual exam.  Doing well.  Past medical history,surgical history, problem list, medications, allergies, family history and social history were all reviewed and documented in the EPIC chart.  ROS:  Performed and pertinent positives and negatives are included in the history, assessment and plan .  Exam: Kim assistant Filed Vitals:   11/19/13 1359  BP: 122/78  Height: 5\' 1"  (1.549 m)  Weight: 173 lb (78.472 kg)   General appearance  Normal Skin grossly normal Head/Neck normal with no cervical or supraclavicular adenopathy thyroid normal Lungs  clear Cardiac RR, without RMG Abdominal  soft, nontender, without masses, organomegaly or hernia. Healing umbilical hernia repair scar Breasts  examined lying and sitting without masses, retractions, discharge or axillary adenopathy. Pelvic  Ext/BUS/vagina normal  Cervix normal  Uterus anteverted, normal size, shape and contour, midline and mobile nontender   Adnexa  Without masses or tenderness    Anus and perineum  Normal   Rectovaginal  Normal sphincter tone without palpated masses or tenderness.    Assessment/Plan:  48 y.o. [id number] female for annual exam with regular menses, vasectomy birth control.   1. Recent repair of her umbilical hernia. Doing well. Regular menses, vasectomy birth control. 2. Pap smear 04/2011. Pap/HPV today. No history of abnormal Pap smears previously. Repeat Pap smear in 3-5 year interval if this Pap smear is normal. 3. Mammography 02/2013. Continue with annual mammography. SBE monthly reviewed. 4. Stop smoking strategies reviewed and encouraged. 5. Starting a new diet exercise program. Patient encouraged. 6. Health maintenance. Baseline CBC comprehensive metabolic panel lipid profile urinalysis ordered. Followup one year, sooner as needed.   Note: This document was prepared with digital dictation and possible smart phrase technology. Any  transcriptional errors that result from this process are unintentional.   Dara LordsFONTAINE,Maudene Stotler P MD, 2:54 PM 11/19/2013

## 2013-11-20 LAB — URINALYSIS W MICROSCOPIC + REFLEX CULTURE
BILIRUBIN URINE: NEGATIVE
Bacteria, UA: NONE SEEN
Casts: NONE SEEN
Crystals: NONE SEEN
GLUCOSE, UA: NEGATIVE mg/dL
HGB URINE DIPSTICK: NEGATIVE
Ketones, ur: 40 mg/dL — AB
LEUKOCYTES UA: NEGATIVE
Nitrite: NEGATIVE
PROTEIN: NEGATIVE mg/dL
Specific Gravity, Urine: 1.007 (ref 1.005–1.030)
Urobilinogen, UA: 0.2 mg/dL (ref 0.0–1.0)
pH: 7 (ref 5.0–8.0)

## 2014-04-01 ENCOUNTER — Other Ambulatory Visit: Payer: Self-pay

## 2014-04-01 DIAGNOSIS — Z1231 Encounter for screening mammogram for malignant neoplasm of breast: Secondary | ICD-10-CM

## 2014-04-09 ENCOUNTER — Ambulatory Visit: Payer: BC Managed Care – PPO

## 2014-04-13 ENCOUNTER — Ambulatory Visit
Admission: RE | Admit: 2014-04-13 | Discharge: 2014-04-13 | Disposition: A | Discharge status: Home / Self Care | Payer: BC Managed Care – PPO | Source: Ambulatory Visit

## 2014-04-13 DIAGNOSIS — Z1231 Encounter for screening mammogram for malignant neoplasm of breast: Secondary | ICD-10-CM

## 2014-06-29 ENCOUNTER — Encounter: Payer: Self-pay | Admitting: Gynecology

## 2014-11-23 ENCOUNTER — Encounter: Payer: BC Managed Care – PPO | Admitting: Gynecology

## 2014-12-01 ENCOUNTER — Encounter: Payer: Self-pay | Admitting: Gynecology

## 2015-01-29 ENCOUNTER — Encounter: Payer: Self-pay | Admitting: Gynecology

## 2015-01-29 ENCOUNTER — Ambulatory Visit (INDEPENDENT_AMBULATORY_CARE_PROVIDER_SITE_OTHER): Payer: BLUE CROSS/BLUE SHIELD | Admitting: Gynecology

## 2015-01-29 VITALS — BP 130/84 | Ht 61.0 in | Wt 175.0 lb

## 2015-01-29 DIAGNOSIS — Z01419 Encounter for gynecological examination (general) (routine) without abnormal findings: Secondary | ICD-10-CM | POA: Diagnosis not present

## 2015-01-29 LAB — CBC WITH DIFFERENTIAL/PLATELET
Basophils Absolute: 0.1 10*3/uL (ref 0.0–0.1)
Basophils Relative: 1 % (ref 0–1)
EOS ABS: 0.4 10*3/uL (ref 0.0–0.7)
Eosinophils Relative: 4 % (ref 0–5)
HEMATOCRIT: 42.6 % (ref 36.0–46.0)
HEMOGLOBIN: 14.4 g/dL (ref 12.0–15.0)
Lymphocytes Relative: 21 % (ref 12–46)
Lymphs Abs: 2 10*3/uL (ref 0.7–4.0)
MCH: 29.9 pg (ref 26.0–34.0)
MCHC: 33.8 g/dL (ref 30.0–36.0)
MCV: 88.4 fL (ref 78.0–100.0)
MPV: 9.8 fL (ref 8.6–12.4)
Monocytes Absolute: 0.6 10*3/uL (ref 0.1–1.0)
Monocytes Relative: 6 % (ref 3–12)
NEUTROS PCT: 68 % (ref 43–77)
Neutro Abs: 6.3 10*3/uL (ref 1.7–7.7)
Platelets: 323 10*3/uL (ref 150–400)
RBC: 4.82 MIL/uL (ref 3.87–5.11)
RDW: 13.2 % (ref 11.5–15.5)
WBC: 9.3 10*3/uL (ref 4.0–10.5)

## 2015-01-29 NOTE — Progress Notes (Signed)
Jessica Dougherty [birth year] [id number]        49 y.o.  [id number] for annual exam.  Doing well without complaints.  Past medical history,surgical history, problem list, medications, allergies, family history and social history were all reviewed and documented as reviewed in the EPIC chart.  ROS:  Performed with pertinent positives and negatives included in the history, assessment and plan.   Additional significant findings :  none   Exam: Delena ServeKim Alexis assistant Filed Vitals:   01/29/15 1556  BP: 130/84  Height: 5\' 1"  (1.549 m)  Weight: 175 lb (79.379 kg)   General appearance:  Normal affect, orientation and appearance. Skin: Grossly normal HEENT: Without gross lesions.  No cervical or supraclavicular adenopathy. Thyroid normal.  Lungs:  Clear without wheezing, rales or rhonchi Cardiac: RR, without RMG Abdominal:  Soft, nontender, without masses, guarding, rebound, organomegaly or hernia Breasts:  Examined lying and sitting without masses, retractions, discharge or axillary adenopathy. Pelvic:  Ext/BUS/vagina normal  Cervix normal  Uterus anteverted, normal size, shape and contour, midline and mobile nontender   Adnexa  Without masses or tenderness    Anus and perineum  Normal   Rectovaginal  Normal sphincter tone without palpated masses or tenderness.    Assessment/Plan:  49 y.o. [id number] female for annual exam with regular menses, vasectomy birth control.   1. Had one skip in her menses in February but resumed regular menses in March. No history of same before. Will keep menstrual calendar as long as recommended menses then follow expectantly. If any prolonged or irregular bleeding represent for further evaluation. 2. Pap smear/HPV negative 2015. No Pap smear done today. 3. Stop smoking 4. Health maintenance. Baseline CBC comprehensive metabolic panel lipid profile urinalysis ordered. Follow up in one year, sooner as needed.     Dara LordsFONTAINE,TIMOTHY P MD, 4:25 PM  01/29/2015

## 2015-01-29 NOTE — Patient Instructions (Signed)
You may obtain a copy of any labs that were done today by logging onto MyChart as outlined in the instructions provided with your AVS (after visit summary). The office will not call with normal lab results but certainly if there are any significant abnormalities then we will contact you.   Health Maintenance, Female A healthy lifestyle and preventative care can promote health and wellness.  Maintain regular health, dental, and eye exams.  Eat a healthy diet. Foods like vegetables, fruits, whole grains, low-fat dairy products, and lean protein foods contain the nutrients you need without too many calories. Decrease your intake of foods high in solid fats, added sugars, and salt. Get information about a proper diet from your caregiver, if necessary.  Regular physical exercise is one of the most important things you can do for your health. Most adults should get at least 150 minutes of moderate-intensity exercise (any activity that increases your heart rate and causes you to sweat) each week. In addition, most adults need muscle-strengthening exercises on 2 or more days a week.   Maintain a healthy weight. The body mass index (BMI) is a screening tool to identify possible weight problems. It provides an estimate of body fat based on height and weight. Your caregiver can help determine your BMI, and can help you achieve or maintain a healthy weight. For adults 20 years and older:  A BMI below 18.5 is considered underweight.  A BMI of 18.5 to 24.9 is normal.  A BMI of 25 to 29.9 is considered overweight.  A BMI of 30 and above is considered obese.  Maintain normal blood lipids and cholesterol by exercising and minimizing your intake of saturated fat. Eat a balanced diet with plenty of fruits and vegetables. Blood tests for lipids and cholesterol should begin at age 61 and be repeated every 5 years. If your lipid or cholesterol levels are high, you are over 50, or you are a high risk for heart  disease, you may need your cholesterol levels checked more frequently.Ongoing high lipid and cholesterol levels should be treated with medicines if diet and exercise are not effective.  If you smoke, find out from your caregiver how to quit. If you do not use tobacco, do not start.  Lung cancer screening is recommended for adults aged 33 80 years who are at high risk for developing lung cancer because of a history of smoking. Yearly low-dose computed tomography (CT) is recommended for people who have at least a 30-pack-year history of smoking and are a current smoker or have quit within the past 15 years. A pack year of smoking is smoking an average of 1 pack of cigarettes a day for 1 year (for example: 1 pack a day for 30 years or 2 packs a day for 15 years). Yearly screening should continue until the smoker has stopped smoking for at least 15 years. Yearly screening should also be stopped for people who develop a health problem that would prevent them from having lung cancer treatment.  If you are pregnant, do not drink alcohol. If you are breastfeeding, be very cautious about drinking alcohol. If you are not pregnant and choose to drink alcohol, do not exceed 1 drink per day. One drink is considered to be 12 ounces (355 mL) of beer, 5 ounces (148 mL) of wine, or 1.5 ounces (44 mL) of liquor.  Avoid use of street drugs. Do not share needles with anyone. Ask for help if you need support or instructions about stopping  the use of drugs.  High blood pressure causes heart disease and increases the risk of stroke. Blood pressure should be checked at least every 1 to 2 years. Ongoing high blood pressure should be treated with medicines, if weight loss and exercise are not effective.  If you are 59 to 48 years old, ask your caregiver if you should take aspirin to prevent strokes.  Diabetes screening involves taking a blood sample to check your fasting blood sugar level. This should be done once every 3  years, after age 91, if you are within normal weight and without risk factors for diabetes. Testing should be considered at a younger age or be carried out more frequently if you are overweight and have at least 1 risk factor for diabetes.  Breast cancer screening is essential preventative care for women. You should practice "breast self-awareness." This means understanding the normal appearance and feel of your breasts and may include breast self-examination. Any changes detected, no matter how small, should be reported to a caregiver. Women in their 66s and 30s should have a clinical breast exam (CBE) by a caregiver as part of a regular health exam every 1 to 3 years. After age 101, women should have a CBE every year. Starting at age 100, women should consider having a mammogram (breast X-ray) every year. Women who have a family history of breast cancer should talk to their caregiver about genetic screening. Women at a high risk of breast cancer should talk to their caregiver about having an MRI and a mammogram every year.  Breast cancer gene (BRCA)-related cancer risk assessment is recommended for women who have family members with BRCA-related cancers. BRCA-related cancers include breast, ovarian, tubal, and peritoneal cancers. Having family members with these cancers may be associated with an increased risk for harmful changes (mutations) in the breast cancer genes BRCA1 and BRCA2. Results of the assessment will determine the need for genetic counseling and BRCA1 and BRCA2 testing.  The Pap test is a screening test for cervical cancer. Women should have a Pap test starting at age 57. Between ages 25 and 35, Pap tests should be repeated every 2 years. Beginning at age 37, you should have a Pap test every 3 years as long as the past 3 Pap tests have been normal. If you had a hysterectomy for a problem that was not cancer or a condition that could lead to cancer, then you no longer need Pap tests. If you are  between ages 50 and 76, and you have had normal Pap tests going back 10 years, you no longer need Pap tests. If you have had past treatment for cervical cancer or a condition that could lead to cancer, you need Pap tests and screening for cancer for at least 20 years after your treatment. If Pap tests have been discontinued, risk factors (such as a new sexual partner) need to be reassessed to determine if screening should be resumed. Some women have medical problems that increase the chance of getting cervical cancer. In these cases, your caregiver may recommend more frequent screening and Pap tests.  The human papillomavirus (HPV) test is an additional test that may be used for cervical cancer screening. The HPV test looks for the virus that can cause the cell changes on the cervix. The cells collected during the Pap test can be tested for HPV. The HPV test could be used to screen women aged 44 years and older, and should be used in women of any age  who have unclear Pap test results. After the age of 55, women should have HPV testing at the same frequency as a Pap test.  Colorectal cancer can be detected and often prevented. Most routine colorectal cancer screening begins at the age of 44 and continues through age 20. However, your caregiver may recommend screening at an earlier age if you have risk factors for colon cancer. On a yearly basis, your caregiver may provide home test kits to check for hidden blood in the stool. Use of a small camera at the end of a tube, to directly examine the colon (sigmoidoscopy or colonoscopy), can detect the earliest forms of colorectal cancer. Talk to your caregiver about this at age 86, when routine screening begins. Direct examination of the colon should be repeated every 5 to 10 years through age 13, unless early forms of pre-cancerous polyps or small growths are found.  Hepatitis C blood testing is recommended for all people born from [birth year] through 1965 and any  individual with known risks for hepatitis C.  Practice safe sex. Use condoms and avoid high-risk sexual practices to reduce the spread of sexually transmitted infections (STIs). Sexually active women aged 36 and younger should be checked for Chlamydia, which is a common sexually transmitted infection. Older women with new or multiple partners should also be tested for Chlamydia. Testing for other STIs is recommended if you are sexually active and at increased risk.  Osteoporosis is a disease in which the bones lose minerals and strength with aging. This can result in serious bone fractures. The risk of osteoporosis can be identified using a bone density scan. Women ages 20 and over and women at risk for fractures or osteoporosis should discuss screening with their caregivers. Ask your caregiver whether you should be taking a calcium supplement or vitamin D to reduce the rate of osteoporosis.  Menopause can be associated with physical symptoms and risks. Hormone replacement therapy is available to decrease symptoms and risks. You should talk to your caregiver about whether hormone replacement therapy is right for you.  Use sunscreen. Apply sunscreen liberally and repeatedly throughout the day. You should seek shade when your shadow is shorter than you. Protect yourself by wearing long sleeves, pants, a wide-brimmed hat, and sunglasses year round, whenever you are outdoors.  Notify your caregiver of new moles or changes in moles, especially if there is a change in shape or color. Also notify your caregiver if a mole is larger than the size of a pencil eraser.  Stay current with your immunizations. Document Released: 02/27/2011 Document Revised: 12/09/2012 Document Reviewed: 02/27/2011 [facility] Patient Information 2014 [employer].

## 2015-01-30 LAB — URINALYSIS W MICROSCOPIC + REFLEX CULTURE
BILIRUBIN URINE: NEGATIVE
Bacteria, UA: NONE SEEN
CASTS: NONE SEEN
Crystals: NONE SEEN
Glucose, UA: NEGATIVE mg/dL
Hgb urine dipstick: NEGATIVE
KETONES UR: NEGATIVE mg/dL
LEUKOCYTES UA: NEGATIVE
NITRITE: NEGATIVE
Protein, ur: NEGATIVE mg/dL
Squamous Epithelial / LPF: NONE SEEN
UROBILINOGEN UA: 0.2 mg/dL (ref 0.0–1.0)
pH: 6 (ref 5.0–8.0)

## 2015-01-30 LAB — COMPREHENSIVE METABOLIC PANEL
ALT: 21 U/L (ref 0–35)
AST: 15 U/L (ref 0–37)
Albumin: 4.2 g/dL (ref 3.5–5.2)
Alkaline Phosphatase: 69 U/L (ref 39–117)
BUN: 9 mg/dL (ref 6–23)
CALCIUM: 8.7 mg/dL (ref 8.4–10.5)
CHLORIDE: 106 meq/L (ref 96–112)
CO2: 26 mEq/L (ref 19–32)
Creat: 0.7 mg/dL (ref 0.50–1.10)
GLUCOSE: 91 mg/dL (ref 70–99)
POTASSIUM: 4.3 meq/L (ref 3.5–5.3)
Sodium: 142 mEq/L (ref 135–145)
TOTAL PROTEIN: 6.6 g/dL (ref 6.0–8.3)
Total Bilirubin: 0.3 mg/dL (ref 0.2–1.2)

## 2015-01-30 LAB — LIPID PANEL
CHOL/HDL RATIO: 3.6 ratio
Cholesterol: 128 mg/dL (ref 0–200)
HDL: 36 mg/dL — AB (ref >=46)
LDL CALC: 44 mg/dL (ref 0–99)
TRIGLYCERIDES: 240 mg/dL — AB (ref <150)
VLDL: 48 mg/dL — AB (ref 0–40)

## 2015-11-24 ENCOUNTER — Encounter: Payer: Self-pay | Admitting: Gynecology

## 2015-11-24 ENCOUNTER — Telehealth: Payer: Self-pay | Admitting: *Deleted

## 2015-11-24 ENCOUNTER — Ambulatory Visit (INDEPENDENT_AMBULATORY_CARE_PROVIDER_SITE_OTHER): Payer: BLUE CROSS/BLUE SHIELD | Admitting: Gynecology

## 2015-11-24 VITALS — BP 122/76

## 2015-11-24 DIAGNOSIS — N644 Mastodynia: Secondary | ICD-10-CM

## 2015-11-24 NOTE — Progress Notes (Signed)
    Richardo Hanksmily L Raulerson 09-18-1965 [id number]        50 y.o.  [id number] Presents with one-month history of intermittent left breast tenderness around the areola. No masses felt or any nipple discharge.  Past medical history,surgical history, problem list, medications, allergies, family history and social history were all reviewed and documented in the EPIC chart.  Directed ROS with pertinent positives and negatives documented in the history of present illness/assessment and plan.  Exam: Kennon PortelaKim Gardner assistant Filed Vitals:   11/24/15 0948  BP: 122/76   General appearance:  Normal Both breast examined lying and sitting without masses, retractions, discharge, adenopathy.  Assessment/Plan:  50 y.o. [id number] with one month history of intermittent left breast mastalgia. Exam is normal. Suspect hormonal related. Recommended baseline diagnostic mammography with ultrasound. If both studies are normal patient will monitor and as long as this discomfort resolves over time then we will follow. If she has any palpable abnormalities or discomfort persists/worsens she'll represent for further evaluation.  Patient agrees with the plan.    Dara LordsFONTAINE,TIMOTHY P MD, 10:19 AM 11/24/2015

## 2015-11-24 NOTE — Telephone Encounter (Signed)
-----   Message from Dara Lordsimothy P Fontaine, MD sent at 11/24/2015 10:20 AM EDT ----- Schedule diagnostic mammography and left breast ultrasound reference one month history of intermittent left breast pain in the areola region.

## 2015-11-24 NOTE — Telephone Encounter (Signed)
Orders placed at breast center, they will contact to schedule. 

## 2015-11-24 NOTE — Patient Instructions (Signed)
Office will call you to arrange the mammogram and ultrasound. Call my office if you do not hear from the mammography facility within 1 week

## 2015-11-25 NOTE — Telephone Encounter (Signed)
Appointment 12/01/15 @ 1:40pm at breast center.

## 2015-12-01 ENCOUNTER — Ambulatory Visit
Admission: RE | Admit: 2015-12-01 | Discharge: 2015-12-01 | Disposition: A | Discharge status: Home / Self Care | Payer: BLUE CROSS/BLUE SHIELD | Source: Ambulatory Visit | Attending: Gynecology | Admitting: Gynecology

## 2015-12-01 DIAGNOSIS — N644 Mastodynia: Secondary | ICD-10-CM

## 2016-02-01 ENCOUNTER — Ambulatory Visit (INDEPENDENT_AMBULATORY_CARE_PROVIDER_SITE_OTHER): Payer: BLUE CROSS/BLUE SHIELD | Admitting: Gynecology

## 2016-02-01 ENCOUNTER — Encounter: Payer: Self-pay | Admitting: Gynecology

## 2016-02-01 VITALS — BP 118/76 | Ht 62.0 in | Wt 178.0 lb

## 2016-02-01 DIAGNOSIS — Z01419 Encounter for gynecological examination (general) (routine) without abnormal findings: Secondary | ICD-10-CM

## 2016-02-01 DIAGNOSIS — Z13220 Encounter for screening for lipoid disorders: Secondary | ICD-10-CM

## 2016-02-01 NOTE — Patient Instructions (Signed)

## 2016-02-01 NOTE — Progress Notes (Signed)
    Jessica Dougherty 1966-07-11 [id number]        50 y.o.  [id number]  for annual exam.  Doing well.  Past medical history,surgical history, problem list, medications, allergies, family history and social history were all reviewed and documented as reviewed in the EPIC chart.  ROS:  Performed with pertinent positives and negatives included in the history, assessment and plan.   Additional significant findings :  none   Exam: Kennon PortelaKim Gardner assistant Filed Vitals:   02/01/16 1548  BP: 118/76  Height: 5\' 2"  (1.575 m)  Weight: 178 lb (80.74 kg)   General appearance:  Normal affect, orientation and appearance. Skin: Grossly normal HEENT: Without gross lesions.  No cervical or supraclavicular adenopathy. Thyroid normal.  Lungs:  Clear without wheezing, rales or rhonchi Cardiac: RR, without RMG Abdominal:  Soft, nontender, without masses, guarding, rebound, organomegaly or hernia Breasts:  Examined lying and sitting without masses, retractions, discharge or axillary adenopathy. Pelvic:  Ext/BUS/vagina normal  Cervix normal  Uterus anteverted, normal size, shape and contour, midline and mobile nontender   Adnexa without masses or tenderness    Anus and perineum normal   Rectovaginal normal sphincter tone without palpated masses or tenderness.    Assessment/Plan:  [age] y.o. [id number] female for annual exam with regular menses, vasectomy birth control.   1. Recent evaluation for mastalgia. Mammography was all negative and she is doing better. Continue with self breast exams monthly and report any issues. Repeat mammogram in one year. 2. Pap smear/HPV 2015 negative. No Pap smear done today. No history of significant abnormal Pap smears previously. 3. Health maintenance. Future orders placed for fasting CBC, CMP, lipid profile. Urinalysis ordered today. Follow up in one year, sooner as needed.  Continues to smoke despite repetitive counseling.   Dara LordsFONTAINE,Jessica Colombo P MD, 4:22 PM  02/01/2016

## 2016-02-02 ENCOUNTER — Other Ambulatory Visit: Payer: BLUE CROSS/BLUE SHIELD

## 2016-02-02 LAB — URINALYSIS W MICROSCOPIC + REFLEX CULTURE
Bacteria, UA: NONE SEEN [HPF]
Bilirubin Urine: NEGATIVE
CASTS: NONE SEEN [LPF]
CRYSTALS: NONE SEEN [HPF]
Glucose, UA: NEGATIVE
Hgb urine dipstick: NEGATIVE
Ketones, ur: NEGATIVE
Leukocytes, UA: NEGATIVE
NITRITE: NEGATIVE
PH: 6 (ref 5.0–8.0)
Protein, ur: NEGATIVE
RBC / HPF: NONE SEEN RBC/HPF (ref <=2)
SPECIFIC GRAVITY, URINE: 1.016 (ref 1.001–1.035)
Squamous Epithelial / LPF: NONE SEEN [HPF] (ref <=5)
WBC, UA: NONE SEEN WBC/HPF (ref <=5)
YEAST: NONE SEEN [HPF]

## 2016-02-03 ENCOUNTER — Other Ambulatory Visit: Payer: BLUE CROSS/BLUE SHIELD

## 2016-02-03 DIAGNOSIS — Z01419 Encounter for gynecological examination (general) (routine) without abnormal findings: Secondary | ICD-10-CM

## 2016-02-03 DIAGNOSIS — Z13220 Encounter for screening for lipoid disorders: Secondary | ICD-10-CM

## 2016-02-03 LAB — CBC WITH DIFFERENTIAL/PLATELET
BASOS ABS: 0 {cells}/uL (ref 0–200)
BASOS PCT: 0 %
EOS ABS: 213 {cells}/uL (ref 15–500)
Eosinophils Relative: 3 %
HCT: 43.5 % (ref 35.0–45.0)
HEMOGLOBIN: 14.4 g/dL (ref 11.7–15.5)
LYMPHS ABS: 1491 {cells}/uL (ref 850–3900)
Lymphocytes Relative: 21 %
MCH: 29.6 pg (ref 27.0–33.0)
MCHC: 33.1 g/dL (ref 32.0–36.0)
MCV: 89.3 fL (ref 80.0–100.0)
MONO ABS: 497 {cells}/uL (ref 200–950)
MONOS PCT: 7 %
MPV: 9.4 fL (ref 7.5–12.5)
NEUTROS ABS: 4899 {cells}/uL (ref 1500–7800)
Neutrophils Relative %: 69 %
PLATELETS: 283 10*3/uL (ref 140–400)
RBC: 4.87 MIL/uL (ref 3.80–5.10)
RDW: 13.6 % (ref 11.0–15.0)
WBC: 7.1 10*3/uL (ref 3.8–10.8)

## 2016-02-03 LAB — LIPID PANEL
CHOL/HDL RATIO: 2.7 ratio (ref <=5.0)
CHOLESTEROL: 116 mg/dL — AB (ref 125–200)
HDL: 43 mg/dL — ABNORMAL LOW (ref >=46)
LDL Cholesterol: 55 mg/dL (ref <130)
TRIGLYCERIDES: 90 mg/dL (ref <150)
VLDL: 18 mg/dL (ref <30)

## 2016-02-03 LAB — COMPREHENSIVE METABOLIC PANEL
ALBUMIN: 3.9 g/dL (ref 3.6–5.1)
ALK PHOS: 73 U/L (ref 33–115)
ALT: 34 U/L — AB (ref 6–29)
AST: 17 U/L (ref 10–35)
BUN: 11 mg/dL (ref 7–25)
CHLORIDE: 107 mmol/L (ref 98–110)
CO2: 24 mmol/L (ref 20–31)
Calcium: 8.5 mg/dL — ABNORMAL LOW (ref 8.6–10.2)
Creat: 0.66 mg/dL (ref 0.50–1.10)
Glucose, Bld: 80 mg/dL (ref 65–99)
POTASSIUM: 4.6 mmol/L (ref 3.5–5.3)
Sodium: 140 mmol/L (ref 135–146)
TOTAL PROTEIN: 6.3 g/dL (ref 6.1–8.1)
Total Bilirubin: 0.3 mg/dL (ref 0.2–1.2)

## 2016-09-07 ENCOUNTER — Encounter: Payer: Self-pay | Admitting: Gynecology

## 2016-09-07 ENCOUNTER — Ambulatory Visit (INDEPENDENT_AMBULATORY_CARE_PROVIDER_SITE_OTHER): Payer: BLUE CROSS/BLUE SHIELD | Admitting: Gynecology

## 2016-09-07 VITALS — BP 122/76

## 2016-09-07 DIAGNOSIS — N816 Rectocele: Secondary | ICD-10-CM | POA: Diagnosis not present

## 2016-09-07 DIAGNOSIS — N926 Irregular menstruation, unspecified: Secondary | ICD-10-CM

## 2016-09-07 NOTE — Progress Notes (Signed)
    Jessica Dougherty 02/28/1966 [id number]        51 y.o.  [id number] presents with 2 complaints:  1. Noticed something bulging from the vagina over the last several weeks in the shower. Feels fleshy. She was able to push it back into the vagina. No pain. No issues with bowel movements. No frequency dysuria urgency. 2. Early menses. Patient's last menstrual period was 08/16/2016. Started bleeding yesterday which is early. Regular menses previously. No pain or other symptoms. Vasectomy birth control.  Past medical history,surgical history, problem list, medications, allergies, family history and social history were all reviewed and documented in the EPIC chart.  Directed ROS with pertinent positives and negatives documented in the history of present illness/assessment and plan.  Exam: Kennon PortelaKim Gardner assistant Vitals:   09/07/16 1112  BP: 122/76   General appearance:  Normal Abdomen soft nontender without masses guarding rebound Pelvic external BUS vagina with slight menses flow. First to second-degree rectocele. No significant cystocele. Cervix normal. Mild uterine descent with straining. Uterus grossly normal size midline mobile nontender. Adnexa without masses or tenderness. Rectal exam confirms rectocele.  Assessment/Plan:  51 y.o. [id number] with:  1. Mild uterine descent and first to second-degree rectocele which I think is what she is feeling. Reviewed the anatomy of the area to include pictures. Options rectocele management reviewed to include expectant management, pessary and surgery. I think if she proceeds with surgery then I would recommend hysterectomy at the same time given the uterine descent. No significant cystocele. At this point the patient wants to wait until her scheduled appointment in June and monitor the situation. Will follow up sooner if any issues. 2. Early start of her menses. As this is the first episode of irregular bleeding will monitor for now. If recurrent irregular  bleeding occurs and she'll represent for further evaluation. If resumes regular menses then will follow.    Dara LordsFONTAINE,TIMOTHY P MD, 11:40 AM 09/07/2016

## 2016-09-07 NOTE — Patient Instructions (Addendum)
Follow up for annual exam in June as scheduled. Follow up sooner if any issues.

## 2017-02-01 ENCOUNTER — Encounter: Payer: BLUE CROSS/BLUE SHIELD | Admitting: Gynecology

## 2017-02-06 ENCOUNTER — Encounter: Payer: Self-pay | Admitting: Gynecology

## 2017-02-06 ENCOUNTER — Ambulatory Visit (INDEPENDENT_AMBULATORY_CARE_PROVIDER_SITE_OTHER): Payer: BLUE CROSS/BLUE SHIELD | Admitting: Gynecology

## 2017-02-06 VITALS — BP 120/76 | Ht 62.0 in | Wt 174.0 lb

## 2017-02-06 DIAGNOSIS — N8189 Other female genital prolapse: Secondary | ICD-10-CM | POA: Diagnosis not present

## 2017-02-06 DIAGNOSIS — Z01411 Encounter for gynecological examination (general) (routine) with abnormal findings: Secondary | ICD-10-CM | POA: Diagnosis not present

## 2017-02-06 LAB — CBC WITH DIFFERENTIAL/PLATELET
Basophils Absolute: 77 {cells}/uL (ref 0–200)
Basophils Relative: 1 %
Eosinophils Absolute: 231 {cells}/uL (ref 15–500)
Eosinophils Relative: 3 %
HCT: 43.6 % (ref 35.0–45.0)
Hemoglobin: 14.4 g/dL (ref 11.7–15.5)
Lymphocytes Relative: 28 %
Lymphs Abs: 2156 {cells}/uL (ref 850–3900)
MCH: 29.8 pg (ref 27.0–33.0)
MCHC: 33 g/dL (ref 32.0–36.0)
MCV: 90.3 fL (ref 80.0–100.0)
MPV: 9.7 fL (ref 7.5–12.5)
Monocytes Absolute: 616 {cells}/uL (ref 200–950)
Monocytes Relative: 8 %
Neutro Abs: 4620 {cells}/uL (ref 1500–7800)
Neutrophils Relative %: 60 %
Platelets: 300 10*3/uL (ref 140–400)
RBC: 4.83 MIL/uL (ref 3.80–5.10)
RDW: 13.6 % (ref 11.0–15.0)
WBC: 7.7 10*3/uL (ref 3.8–10.8)

## 2017-02-06 LAB — TSH: TSH: 6.6 m[IU]/L — AB

## 2017-02-06 NOTE — Patient Instructions (Signed)
Follow up in one year for annual exam. Follow up sooner if the rectocele becomes symptomatic and you want to rediscuss surgery.

## 2017-02-06 NOTE — Progress Notes (Signed)
    Jessica Dougherty 10-Aug-1966 [id number]        51 y.o.  [id number] for annual exam.    Past medical history,surgical history, problem list, medications, allergies, family history and social history were all reviewed and documented as reviewed in the EPIC chart.  ROS:  Performed with pertinent positives and negatives included in the history, assessment and plan.   Additional significant findings :  None   Exam: Kennon PortelaKim Gardner assistant Vitals:   02/06/17 1549  BP: 120/76  Weight: 174 lb (78.9 kg)  Height: 5\' 2"  (1.575 m)   Body mass index is 31.83 kg/m.  General appearance:  Normal affect, orientation and appearance. Skin: Grossly normal HEENT: Without gross lesions.  No cervical or supraclavicular adenopathy. Thyroid normal.  Lungs:  Clear without wheezing, rales or rhonchi Cardiac: RR, without RMG Abdominal:  Soft, nontender, without masses, guarding, rebound, organomegaly or hernia Breasts:  Examined lying and sitting without masses, retractions, discharge or axillary adenopathy. Pelvic:  Ext, BUS, Vagina: With first to second-degree rectocele and mild uterine descent, no significant cystocele  Cervix: Normal  Uterus: Anteverted, normal size, shape and contour, midline and mobile nontender   Adnexa: Without masses or tenderness    Anus and perineum: Normal   Rectovaginal: Normal sphincter tone without palpated masses or tenderness.    Assessment/Plan:  51 y.o. [id number] female for annual exam with regular menses, vasectomy birth control.   1. Rectocele with mild uterine descent. Initially noticed in January when she felt a bulge. Stable on exam. Has not been overly symptomatic to the patient. Reviewed options to include observation, pessary and surgery to include TVH with or without BSO and posterior colporrhaphy. She does have a history of cesarean section 2 in the issues of surgery to include possible laparoscopic assistance up to including TAH discussed. General risks of  surgery reviewed. The pros and cons of each choice were discussed. Patient wants to think about this and will follow up if she wants to pursue surgery. She is not interested in pessary. 2. Pap smear/HPV 10/2013. No history of significant abnormal Pap smears. Plan repeat Pap smear at 5 year interval per current screening guidelines. 3. Colonoscopy never. [place] gastroenter[place]. 4. Mammography 11/2015. Patient to schedule mammogram now. SBE monthly reviewed. Breast exam normal today. 5. Health maintenance. Lipid profile excellent last year. CBC, CMP and TSH ordered today. Follow up in one year, sooner if wants to rediscuss surgery.   Dara LordsFONTAINE,Graves Nipp P MD, 4:46 PM 02/06/2017

## 2017-02-07 ENCOUNTER — Other Ambulatory Visit: Payer: Self-pay | Admitting: *Deleted

## 2017-02-07 ENCOUNTER — Other Ambulatory Visit: Payer: BLUE CROSS/BLUE SHIELD

## 2017-02-07 DIAGNOSIS — R7989 Other specified abnormal findings of blood chemistry: Secondary | ICD-10-CM

## 2017-02-07 LAB — COMPREHENSIVE METABOLIC PANEL
ALK PHOS: 70 U/L (ref 33–130)
ALT: 15 U/L (ref 6–29)
AST: 14 U/L (ref 10–35)
Albumin: 3.9 g/dL (ref 3.6–5.1)
BILIRUBIN TOTAL: 0.3 mg/dL (ref 0.2–1.2)
BUN: 7 mg/dL (ref 7–25)
CALCIUM: 8.6 mg/dL (ref 8.6–10.4)
CO2: 23 mmol/L (ref 20–31)
Chloride: 106 mmol/L (ref 98–110)
Creat: 0.74 mg/dL (ref 0.50–1.05)
Glucose, Bld: 97 mg/dL (ref 65–99)
POTASSIUM: 4 mmol/L (ref 3.5–5.3)
Sodium: 138 mmol/L (ref 135–146)
TOTAL PROTEIN: 6.3 g/dL (ref 6.1–8.1)

## 2017-02-08 ENCOUNTER — Other Ambulatory Visit: Payer: BLUE CROSS/BLUE SHIELD

## 2017-02-08 ENCOUNTER — Other Ambulatory Visit: Payer: Self-pay | Admitting: *Deleted

## 2017-02-08 DIAGNOSIS — Z1329 Encounter for screening for other suspected endocrine disorder: Secondary | ICD-10-CM

## 2017-02-08 LAB — TSH: TSH: 3.74 mIU/L

## 2017-04-25 ENCOUNTER — Other Ambulatory Visit: Payer: Self-pay | Admitting: Gynecology

## 2017-04-25 DIAGNOSIS — Z1231 Encounter for screening mammogram for malignant neoplasm of breast: Secondary | ICD-10-CM

## 2017-05-03 ENCOUNTER — Ambulatory Visit
Admission: RE | Admit: 2017-05-03 | Discharge: 2017-05-03 | Disposition: A | Discharge status: Home / Self Care | Payer: BLUE CROSS/BLUE SHIELD | Source: Ambulatory Visit | Attending: Gynecology | Admitting: Gynecology

## 2017-05-03 DIAGNOSIS — Z1231 Encounter for screening mammogram for malignant neoplasm of breast: Secondary | ICD-10-CM

## 2018-02-07 ENCOUNTER — Encounter: Payer: BLUE CROSS/BLUE SHIELD | Admitting: Gynecology

## 2018-02-12 ENCOUNTER — Ambulatory Visit: Payer: BLUE CROSS/BLUE SHIELD | Admitting: Gynecology

## 2018-02-12 ENCOUNTER — Encounter: Payer: Self-pay | Admitting: Gynecology

## 2018-02-12 VITALS — BP 118/76 | Ht 61.0 in | Wt 176.0 lb

## 2018-02-12 DIAGNOSIS — N816 Rectocele: Secondary | ICD-10-CM | POA: Diagnosis not present

## 2018-02-12 DIAGNOSIS — Z13220 Encounter for screening for lipoid disorders: Secondary | ICD-10-CM

## 2018-02-12 DIAGNOSIS — Z1151 Encounter for screening for human papillomavirus (HPV): Secondary | ICD-10-CM | POA: Diagnosis not present

## 2018-02-12 DIAGNOSIS — Z01419 Encounter for gynecological examination (general) (routine) without abnormal findings: Secondary | ICD-10-CM | POA: Diagnosis not present

## 2018-02-12 NOTE — Patient Instructions (Signed)
Follow-up for annual exam in 1 year 

## 2018-02-12 NOTE — Addendum Note (Signed)
Addended by: Dayna BarkerGARDNER, Ananias Kolander K on: 02/12/2018 09:51 AM   Modules accepted: Orders

## 2018-02-12 NOTE — Progress Notes (Signed)
    Jessica Dougherty 1965/10/30 [id number]        [age] y[age] Hanks.o.  [id number] for annual gynecologic exam.  Having some menopausal symptoms as discussed below.  Past medical history,surgical history, problem list, medications, allergies, family history and social history were all reviewed and documented as reviewed in the EPIC chart.  ROS:  Performed with pertinent positives and negatives included in the history, assessment and plan.   Additional significant findings : None   Exam: Kennon PortelaKim Gardner assistant Vitals:   02/12/18 0911  BP: 118/76  Weight: 176 lb (79.8 kg)  Height: 5\' 1"  (1.549 m)   Body mass index is 33.25 kg/m.  General appearance:  Normal affect, orientation and appearance. Skin: Grossly normal HEENT: Without gross lesions.  No cervical or supraclavicular adenopathy. Thyroid normal.  Lungs:  Clear without wheezing, rales or rhonchi Cardiac: RR, without RMG Abdominal:  Soft, nontender, without masses, guarding, rebound, organomegaly or hernia Breasts:  Examined lying and sitting without masses, retractions, discharge or axillary adenopathy. Pelvic:  Ext, BUS, Vagina: Slight menstrual flow.  Second-degree rectocele.  Mild uterine descent.  Cervix: Normal with slight menstrual flow.  Pap smear/HPV  Uterus: Anteverted, normal size, shape and contour, midline and mobile nontender   Adnexa: Without masses or tenderness    Anus and perineum: Normal   Rectovaginal: Normal sphincter tone without palpated masses or tenderness.    Assessment/Plan:  52 y.o. [id number] female for annual gynecologic exam with overall regular menses, vasectomy birth control.   1. Perimenopause.  Patient having some perimenopausal symptoms to include night sweats and intermittent hot flushes.  Skipped May use menses but then had menses last week.  Otherwise regular monthly menses.  We discussed what to expect in the perimenopausal timeframe.  She will keep a menstrual calendar and as long as no significant  irregularity or significant menopausal symptoms then will monitor.  If otherwise then will follow-up for reevaluation and discussion of treatment options. 2. Rectocele.  Stable on serial exams.  Not significantly symptomatic to the patient.  At this point patient is comfortable with expectant management.  She will follow-up if she develops significant symptoms. 3. Mammography coming due this fall and I reminded her to schedule this.  Breast exam normal today. 4. Pap smear/HPV 10/2013.  Pap smear/HPV done today.  No history of significant abnormal Pap smears.  Will continue with every 5-year screening with Pap smear/HPV per current screening guidelines. 5. Colonoscopy recently discovered cecal mass.  Biopsy showed precancerous changes with recommendation for excision.  Patient had consult with general surgeon and now is in the process of arranging a second opinion.  She will continue to follow-up with them in reference to management of this. 6. Health maintenance.  Baseline CBC, CMP and lipid profile ordered today.  Follow-up in 1 year, sooner if any issues.   Dara Lordsimothy P Deddrick Saindon MD, 9:38 AM 02/12/2018

## 2018-02-13 ENCOUNTER — Other Ambulatory Visit: Payer: BLUE CROSS/BLUE SHIELD

## 2018-02-13 LAB — CBC WITH DIFFERENTIAL/PLATELET
Basophils Absolute: 59 cells/uL (ref 0–200)
Basophils Relative: 1 %
EOS PCT: 3.4 %
Eosinophils Absolute: 201 cells/uL (ref 15–500)
HCT: 43 % (ref 35.0–45.0)
Hemoglobin: 15 g/dL (ref 11.7–15.5)
LYMPHS ABS: 1569 {cells}/uL (ref 850–3900)
MCH: 30.5 pg (ref 27.0–33.0)
MCHC: 34.9 g/dL (ref 32.0–36.0)
MCV: 87.6 fL (ref 80.0–100.0)
MONOS PCT: 6.9 %
MPV: 9.9 fL (ref 7.5–12.5)
Neutro Abs: 3664 cells/uL (ref 1500–7800)
Neutrophils Relative %: 62.1 %
PLATELETS: 288 10*3/uL (ref 140–400)
RBC: 4.91 10*6/uL (ref 3.80–5.10)
RDW: 12.1 % (ref 11.0–15.0)
Total Lymphocyte: 26.6 %
WBC mixed population: 407 cells/uL (ref 200–950)
WBC: 5.9 10*3/uL (ref 3.8–10.8)

## 2018-02-13 LAB — COMPREHENSIVE METABOLIC PANEL
AG Ratio: 1.8 (calc) (ref 1.0–2.5)
ALBUMIN MSPROF: 3.9 g/dL (ref 3.6–5.1)
ALT: 28 U/L (ref 6–29)
AST: 18 U/L (ref 10–35)
Alkaline phosphatase (APISO): 73 U/L (ref 33–130)
BUN: 8 mg/dL (ref 7–25)
CALCIUM: 8.8 mg/dL (ref 8.6–10.4)
CO2: 24 mmol/L (ref 20–32)
CREATININE: 0.7 mg/dL (ref 0.50–1.05)
Chloride: 108 mmol/L (ref 98–110)
GLUCOSE: 96 mg/dL (ref 65–99)
Globulin: 2.2 g/dL (calc) (ref 1.9–3.7)
POTASSIUM: 4.5 mmol/L (ref 3.5–5.3)
SODIUM: 140 mmol/L (ref 135–146)
TOTAL PROTEIN: 6.1 g/dL (ref 6.1–8.1)
Total Bilirubin: 0.4 mg/dL (ref 0.2–1.2)

## 2018-02-13 LAB — LIPID PANEL
CHOL/HDL RATIO: 3.1 (calc) (ref <5.0)
Cholesterol: 124 mg/dL (ref <200)
HDL: 40 mg/dL — ABNORMAL LOW (ref >50)
LDL CHOLESTEROL (CALC): 63 mg/dL
NON-HDL CHOLESTEROL (CALC): 84 mg/dL (ref <130)
TRIGLYCERIDES: 127 mg/dL (ref <150)

## 2018-02-14 LAB — PAP IG AND HPV HIGH-RISK: HPV DNA High Risk: NOT DETECTED

## 2018-06-28 ENCOUNTER — Other Ambulatory Visit: Payer: Self-pay | Admitting: Gynecology

## 2018-06-28 DIAGNOSIS — Z1231 Encounter for screening mammogram for malignant neoplasm of breast: Secondary | ICD-10-CM

## 2018-08-09 ENCOUNTER — Ambulatory Visit
Admission: RE | Admit: 2018-08-09 | Discharge: 2018-08-09 | Disposition: A | Discharge status: Home / Self Care | Payer: BLUE CROSS/BLUE SHIELD | Source: Ambulatory Visit | Attending: Gynecology | Admitting: Gynecology

## 2018-08-09 DIAGNOSIS — Z1231 Encounter for screening mammogram for malignant neoplasm of breast: Secondary | ICD-10-CM

## 2019-02-17 ENCOUNTER — Encounter: Payer: BLUE CROSS/BLUE SHIELD | Admitting: Gynecology

## 2019-02-21 ENCOUNTER — Encounter: Payer: BLUE CROSS/BLUE SHIELD | Admitting: Gynecology

## 2019-05-21 ENCOUNTER — Encounter: Payer: Self-pay | Admitting: Gynecology

## 2019-10-29 IMAGING — MG DIGITAL SCREENING BILATERAL MAMMOGRAM WITH TOMO AND CAD
8 series · 9 of 24 positions shown · non-contrast
Comparison: Previous exam(s).

CLINICAL DATA: Screening.

EXAM:
DIGITAL SCREENING BILATERAL MAMMOGRAM WITH TOMO AND CAD

[L CC synth-2D]
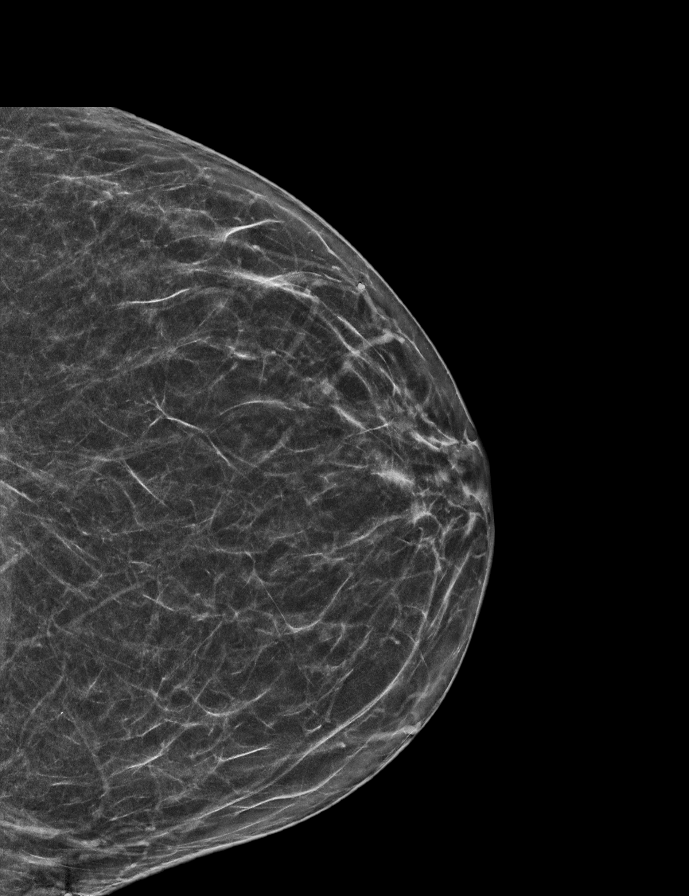

[L MLO synth-2D]
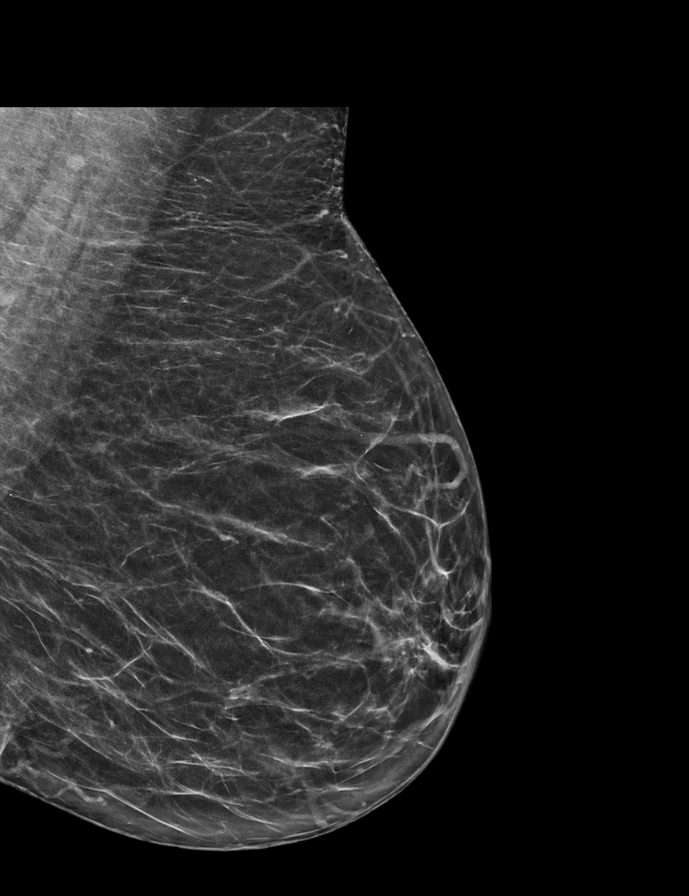

[R CC synth-2D]
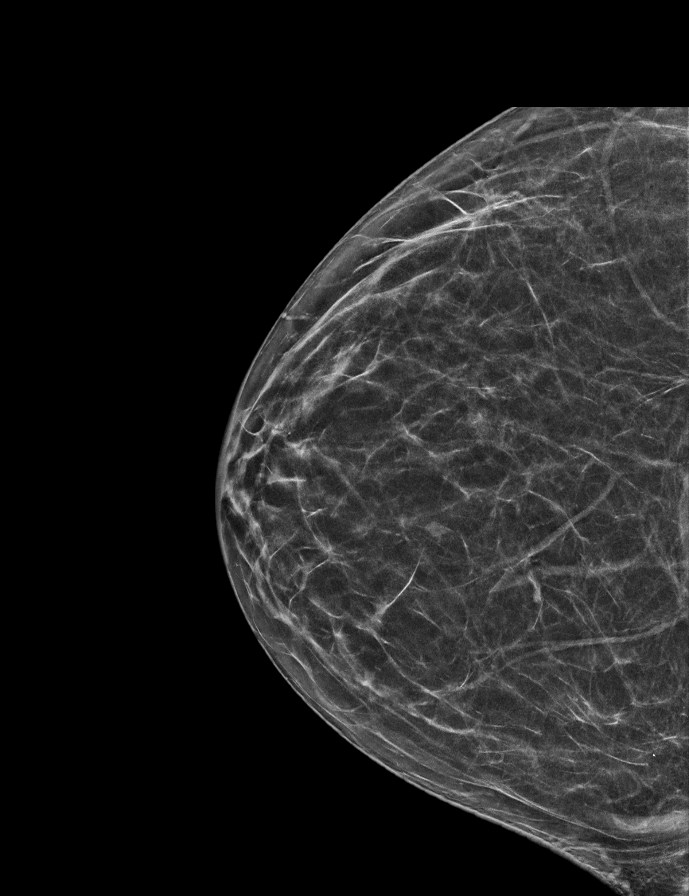

[R MLO synth-2D]
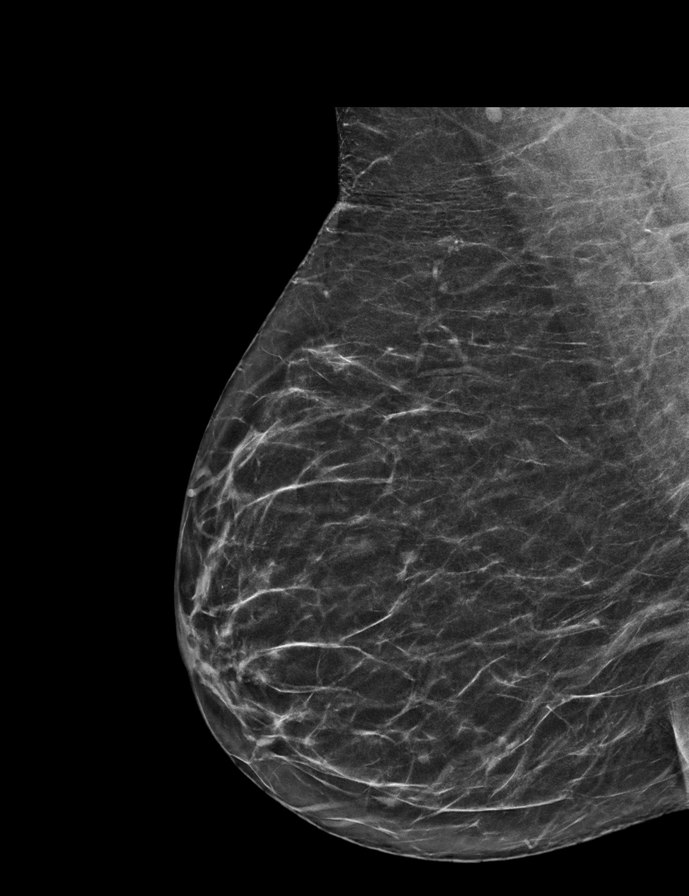

[R CC tomo · 2 of 57 frames shown]
[frame 19/57]
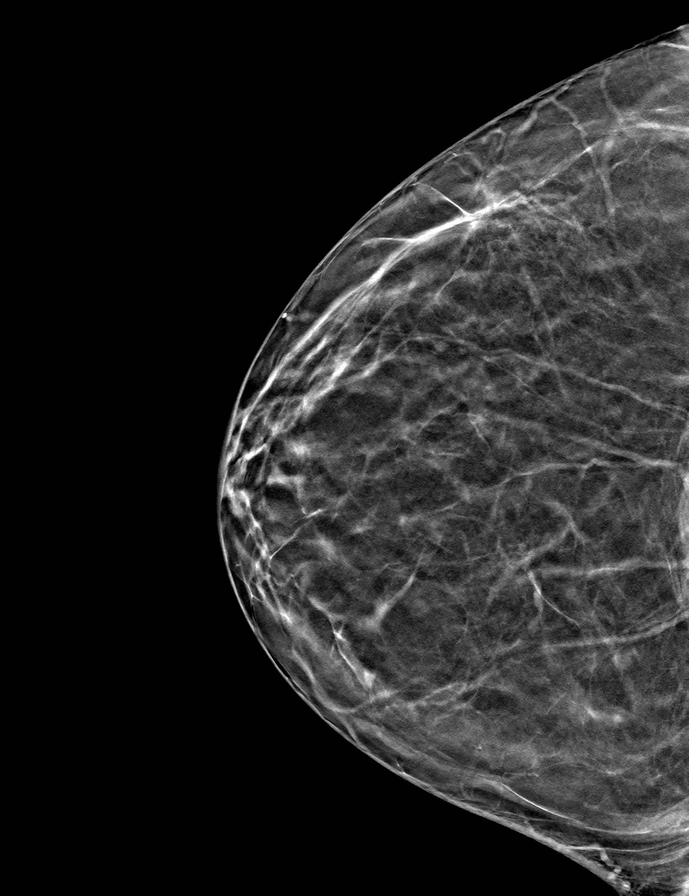
[frame 29/57]
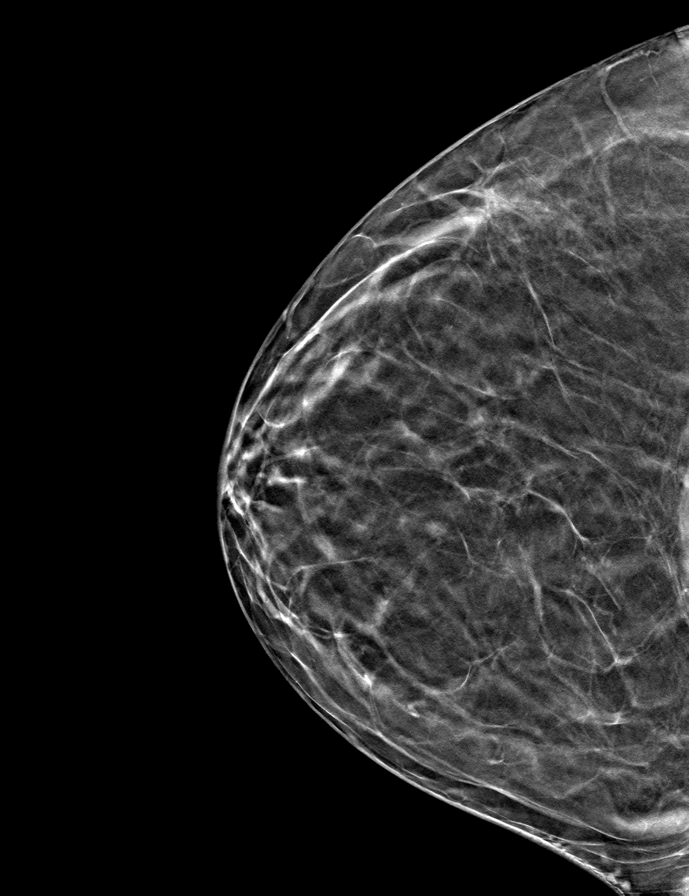

[L CC tomo · tomo slice 28/55.0]
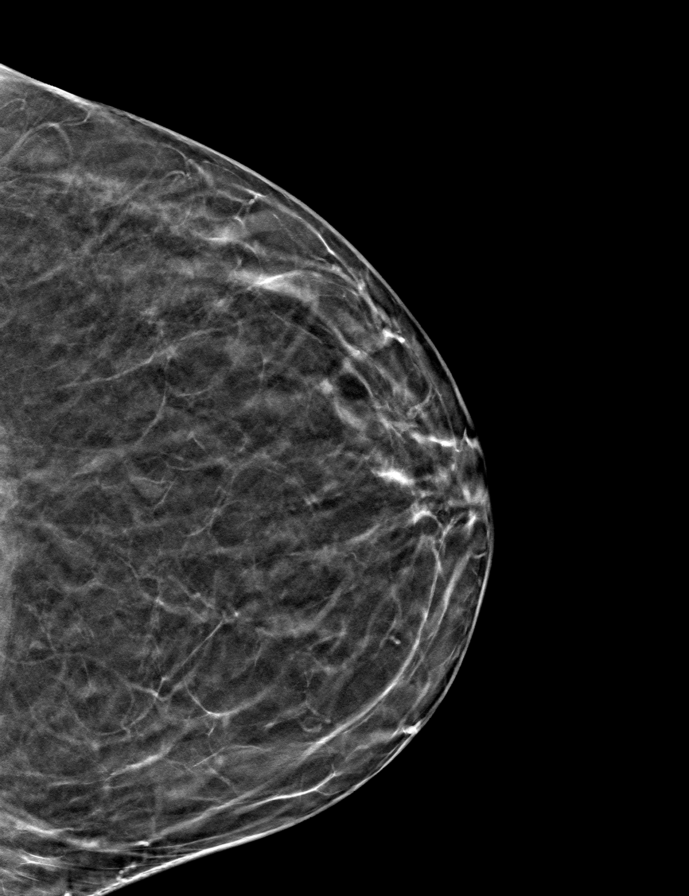

[L MLO tomo · tomo slice 32/63.0]
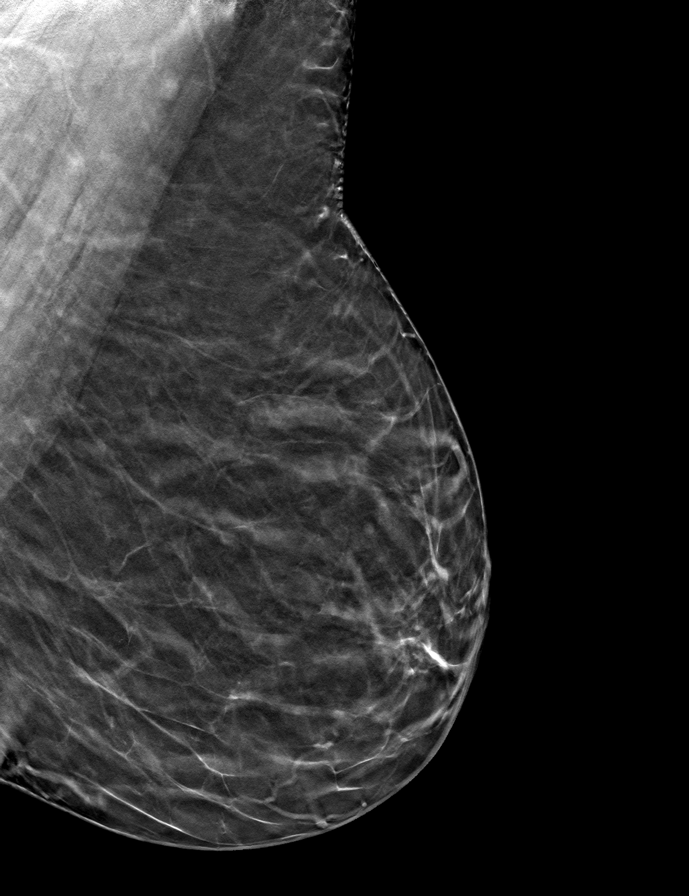

[R MLO tomo · tomo slice 31/62.0]
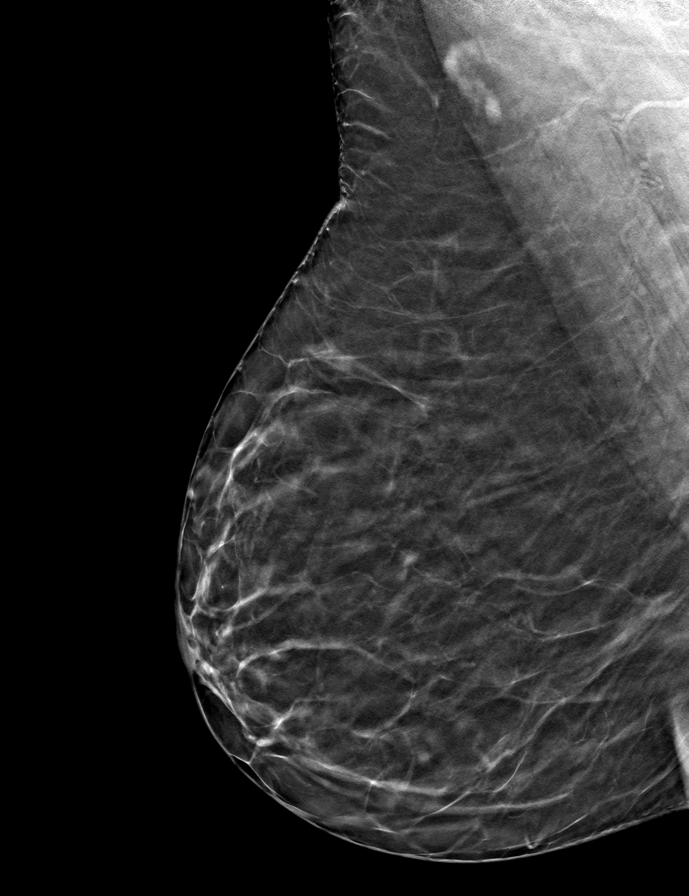

[9 of 24 positions shown; findings below may reference images not displayed]

ACR Breast Density Category b: There are scattered areas of
fibroglandular density.
FINDINGS: There are no findings suspicious for malignancy. Images were
processed with CAD.
IMPRESSION: No mammographic evidence of malignancy. A result letter of this
screening mammogram will be mailed directly to the patient.

RECOMMENDATION:
Screening mammogram in one year. (Code:[id number])

BI-RADS CATEGORY  1: Negative.
